# Patient Record
Sex: Female | Born: 1980 | Race: Black or African American | Hispanic: No | Marital: Single | Smoking: Former smoker
Health system: Southern US, Community
[De-identification: ages and names within clinical notes are randomized; demographics above are authoritative.]

## PROBLEM LIST (undated history)

## (undated) ENCOUNTER — Inpatient Hospital Stay (HOSPITAL_COMMUNITY): Payer: Self-pay

## (undated) DIAGNOSIS — Z789 Other specified health status: Secondary | ICD-10-CM

## (undated) HISTORY — PX: BREAST ENHANCEMENT SURGERY: SHX7

## (undated) HISTORY — PX: LIPOSUCTION: SHX10

---

## 2013-02-13 ENCOUNTER — Emergency Department (INDEPENDENT_AMBULATORY_CARE_PROVIDER_SITE_OTHER)
Admission: EM | Admit: 2013-02-13 | Discharge: 2013-02-13 | Disposition: A | Discharge status: Home / Self Care | Payer: Self-pay | Source: Home / Self Care

## 2013-02-13 ENCOUNTER — Encounter (HOSPITAL_COMMUNITY): Payer: Self-pay | Admitting: Emergency Medicine

## 2013-02-13 DIAGNOSIS — J029 Acute pharyngitis, unspecified: Secondary | ICD-10-CM

## 2013-02-13 DIAGNOSIS — B9789 Other viral agents as the cause of diseases classified elsewhere: Secondary | ICD-10-CM

## 2013-02-13 DIAGNOSIS — J028 Acute pharyngitis due to other specified organisms: Secondary | ICD-10-CM

## 2013-02-13 DIAGNOSIS — R0982 Postnasal drip: Secondary | ICD-10-CM

## 2013-02-13 DIAGNOSIS — J069 Acute upper respiratory infection, unspecified: Secondary | ICD-10-CM

## 2013-02-13 MED ORDER — AMPICILLIN 250 MG PO CAPS: 250.0000 mg | ORAL_CAPSULE | Freq: Four times a day (QID) | ORAL | Status: DC

## 2013-02-13 NOTE — Discharge Instructions (Signed)
Antibiotic Resistance Antibiotics are drugs. They fight infections caused by bacteria. Antibiotics greatly reduce illness and death from infectious diseases. Over time, the bacteria that antibiotics once controlled are much harder to kill. CAUSES  Antibiotic resistance occurs when bacteria change in some way. These changes can lessen the abilities of drugs designed to cure infections. The over-use of antibiotics can cause antibiotic resistance. Almost all important bacterial infections in the world are becoming resistant to drugs. Antibiotic resistance has been called one of the world's most pressing public health problems.  Antibiotics should be used to treat bacterial infections. But they are not effective against viral infections. These include the common cold, most sore throats, and the flu. Smart use of antibiotics will control the spread of resistance.  TREATMENT   Only use antibiotics as prescribed by your caregiver.  Talk with your caregiver about antibiotic resistance.  Ask what else you can do to feel better.  Do not take an antibiotic for a viral infection. This could be a cold, cough or the flu.  Do not save some of your antibiotic for the next time you get sick.  Take an antibiotic exactly as the caregiver tells you.  Do not take an antibiotic that is prescribed for someone else.  Use the antibiotic as directed. Take the correct dose at the scheduled time. SEEK MEDICAL CARE IF:  You react to the antibiotic with:  A rash.  Itching.  An upset stomach. Document Released: 03/25/2002 Document Revised: 03/27/2011 Document Reviewed: 10/28/2007 [facility] Patient Information 2014 [place].  Sore Throat A sore throat is pain, burning, irritation, or scratchiness of the throat. There is often pain or tenderness when swallowing or talking. A sore throat may be accompanied by other symptoms, such as coughing, sneezing, fever, and swollen neck glands. A sore throat is often  the first sign of another sickness, such as a cold, flu, strep throat, or mononucleosis (commonly known as mono). Most sore throats go away without medical treatment. CAUSES  The most common causes of a sore throat include:  A viral infection, such as a cold, flu, or mono.  A bacterial infection, such as strep throat, tonsillitis, or whooping cough.  Seasonal allergies.  Dryness in the air.  Irritants, such as smoke or pollution.  Gastroesophageal reflux disease (GERD). HOME CARE INSTRUCTIONS   Only take over-the-counter medicines as directed by your caregiver.  Drink enough fluids to keep your urine clear or pale yellow.  Rest as needed.  Try using throat sprays, lozenges, or sucking on hard candy to ease any pain (if older than 4 years or as directed).  Sip warm liquids, such as broth, herbal tea, or warm water with honey to relieve pain temporarily. You may also eat or drink cold or frozen liquids such as frozen ice pops.  Gargle with salt water (mix 1 tsp salt with 8 oz of water).  Do not smoke and avoid secondhand smoke.  Put a cool-mist humidifier in your bedroom at night to moisten the air. You can also turn on a hot shower and sit in the bathroom with the door closed for 5 10 minutes. SEEK IMMEDIATE MEDICAL CARE IF:  You have difficulty breathing.  You are unable to swallow fluids, soft foods, or your saliva.  You have increased swelling in the throat.  Your sore throat does not get better in 7 days.  You have nausea and vomiting.  You have a fever or persistent symptoms for more than 2 3 days.  You  have a fever and your symptoms suddenly get worse. MAKE SURE YOU:   Understand these instructions.  Will watch your condition.  Will get help right away if you are not doing well or get worse. Document Released: 02/10/2004 Document Revised: 12/20/2011 Document Reviewed: 09/10/2011 [facility] Patient Information 2014 [place], [place].  Upper Respiratory  Infection, Adult An upper respiratory infection (URI) is also sometimes known as the common cold. The upper respiratory tract includes the nose, sinuses, throat, trachea, and bronchi. Bronchi are the airways leading to the lungs. Most people improve within 1 week, but symptoms can last up to 2 weeks. A residual cough may last even longer.  CAUSES Many different viruses can infect the tissues lining the upper respiratory tract. The tissues become irritated and inflamed and often become very moist. Mucus production is also common. A cold is contagious. You can easily spread the virus to others by oral contact. This includes kissing, sharing a glass, coughing, or sneezing. Touching your mouth or nose and then touching a surface, which is then touched by another person, can also spread the virus. SYMPTOMS  Symptoms typically develop 1 to 3 days after you come in contact with a cold virus. Symptoms vary from person to person. They may include:  Runny nose.  Sneezing.  Nasal congestion.  Sinus irritation.  Sore throat.  Loss of voice (laryngitis).  Cough.  Fatigue.  Muscle aches.  Loss of appetite.  Headache.  Low-grade fever. DIAGNOSIS  You might diagnose your own cold based on familiar symptoms, since most people get a cold 2 to 3 times a year. Your caregiver can confirm this based on your exam. Most importantly, your caregiver can check that your symptoms are not due to another disease such as strep throat, sinusitis, pneumonia, asthma, or epiglottitis. Blood tests, throat tests, and X-rays are not necessary to diagnose a common cold, but they may sometimes be helpful in excluding other more serious diseases. Your caregiver will decide if any further tests are required. RISKS AND COMPLICATIONS  You may be at risk for a more severe case of the common cold if you smoke cigarettes, have chronic heart disease (such as heart failure) or lung disease (such as asthma), or if you have a  weakened immune system. The very young and very old are also at risk for more serious infections. Bacterial sinusitis, middle ear infections, and bacterial pneumonia can complicate the common cold. The common cold can worsen asthma and chronic obstructive pulmonary disease (COPD). Sometimes, these complications can require emergency medical care and may be life-threatening. PREVENTION  The best way to protect against getting a cold is to practice good hygiene. Avoid oral or hand contact with people with cold symptoms. Wash your hands often if contact occurs. There is no clear evidence that vitamin C, vitamin E, echinacea, or exercise reduces the chance of developing a cold. However, it is always recommended to get plenty of rest and practice good nutrition. TREATMENT  Treatment is directed at relieving symptoms. There is no cure. Antibiotics are not effective, because the infection is caused by a virus, not by bacteria. Treatment may include:  Increased fluid intake. Sports drinks offer valuable electrolytes, sugars, and fluids.  Breathing heated mist or steam (vaporizer or shower).  Eating chicken soup or other clear broths, and maintaining good nutrition.  Getting plenty of rest.  Using gargles or lozenges for comfort.  Controlling fevers with ibuprofen or acetaminophen as directed by your caregiver.  Increasing usage of your inhaler if  you have asthma. Zinc gel and zinc lozenges, taken in the first 24 hours of the common cold, can shorten the duration and lessen the severity of symptoms. Pain medicines may help with fever, muscle aches, and throat pain. A variety of non-prescription medicines are available to treat congestion and runny nose. Your caregiver can make recommendations and may suggest nasal or lung inhalers for other symptoms.  HOME CARE INSTRUCTIONS   Only take over-the-counter or prescription medicines for pain, discomfort, or fever as directed by your caregiver.  Use a warm  mist humidifier or inhale steam from a shower to increase air moisture. This may keep secretions moist and make it easier to breathe.  Drink enough water and fluids to keep your urine clear or pale yellow.  Rest as needed.  Return to work when your temperature has returned to normal or as your caregiver advises. You may need to stay home longer to avoid infecting others. You can also use a face mask and careful hand washing to prevent spread of the virus. SEEK MEDICAL CARE IF:   After the first few days, you feel you are getting worse rather than better.  You need your caregiver's advice about medicines to control symptoms.  You develop chills, worsening shortness of breath, or brown or red sputum. These may be signs of pneumonia.  You develop yellow or brown nasal discharge or pain in the face, especially when you bend forward. These may be signs of sinusitis.  You develop a fever, swollen neck glands, pain with swallowing, or white areas in the back of your throat. These may be signs of strep throat. SEEK IMMEDIATE MEDICAL CARE IF:   You have a fever.  You develop severe or persistent headache, ear pain, sinus pain, or chest pain.  You develop wheezing, a prolonged cough, cough up blood, or have a change in your usual mucus (if you have chronic lung disease).  You develop sore muscles or a stiff neck. Document Released: 06/28/2000 Document Revised: 03/27/2011 Document Reviewed: 05/06/2010 [facility] Patient Information 2014 [place], [place].

## 2013-02-13 NOTE — ED Provider Notes (Signed)
CSN: [id number]     Arrival date & time 02/13/13  1015 History   First MD Initiated Contact with Patient 02/13/13 1137     Chief Complaint  Patient presents with  . URI   (Consider location/radiation/quality/duration/timing/severity/associated sxs/prior Treatment) HPI Comments: 33 year old female who presents with a sore throat for 3 days, PND, occasional cough. Denies fever or stuffy nose. She does have a running nose. No facial pain.   History reviewed. No pertinent past medical history. No past surgical history on file. No family history on file. History  Substance Use Topics  . Smoking status: Not on file  . Smokeless tobacco: Not on file  . Alcohol Use: Not on file   OB History   Grav Para Term Preterm Abortions TAB SAB Ect Mult Living                 Review of Systems  Constitutional: Negative for fever, chills, activity change, appetite change and fatigue.  HENT: Positive for congestion, postnasal drip, rhinorrhea and sore throat. Negative for ear discharge and facial swelling.   Eyes: Negative.   Respiratory: Negative.  Negative for cough, shortness of breath and wheezing.   Cardiovascular: Negative.   Gastrointestinal: Negative.   Genitourinary: Negative.   Musculoskeletal: Negative for neck pain and neck stiffness.  Skin: Negative for pallor and rash.  Neurological: Negative.     Allergies  Review of patient's allergies indicates no known allergies.  Home Medications   Current Outpatient Rx  Name  Route  Sig  Dispense  Refill  . ampicillin (PRINCIPEN) 250 MG capsule   Oral   Take 1 capsule (250 mg total) by mouth 4 (four) times daily.   28 capsule   0    BP 119/78  Pulse 80  Temp(Src) 98.1 F (36.7 C) (Oral)  Resp 18  SpO2 97%  LMP 02/03/2013 Physical Exam  Nursing note and vitals reviewed. Constitutional: She is oriented to person, place, and time. She appears well-developed and well-nourished. No distress.  HENT:  Mouth/Throat: No  oropharyngeal exudate.  Bilateral TMs are normal oral pharynx with moderate erythema, cobblestoning and copious amount of clear PND.   Eyes: Conjunctivae and EOM are normal.  Neck: Normal range of motion. Neck supple.  Cardiovascular: Normal rate, regular rhythm and normal heart sounds.   Pulmonary/Chest: Effort normal and breath sounds normal. No respiratory distress. She has no wheezes. She has no rales.  Musculoskeletal: Normal range of motion. She exhibits no edema.  Lymphadenopathy:    She has no cervical adenopathy.  Neurological: She is alert and oriented to person, place, and time.  Skin: Skin is warm and dry. No rash noted.  Psychiatric: She has a normal mood and affect.    ED Course  Procedures (including critical care time) Labs Review Labs Reviewed - No data to display Imaging Review No results found.    MDM   1. URI (upper respiratory infection)   2. PND (post-nasal drip)   3. Sore throat (viral)      Pt has upcoming breast augmentation and insists on an antibiotic to clear all this out so she will not have to cancel surgery She is advised low probability of bacterial infection Alka seltzer cold plus  night timed  Med and tobitussin DM Plenty of fluids, nasal saline , netty pot  Hayden Rasmussenavid Presly Steinruck, NP 02/13/13 1211

## 2013-02-13 NOTE — ED Provider Notes (Signed)
Medical screening examination/treatment/procedure(s) were performed by non-physician practitioner and as supervising physician I was immediately available for consultation/collaboration.  Dontrey Snellgrove, M.D.   Viktorya Arguijo C Perl Folmar, MD 02/13/13 1910 

## 2013-02-13 NOTE — ED Notes (Signed)
C/o cold sx States she has sore throat, brown mucous congestion, achy back OTC medication taking but no relief.

## 2013-08-28 ENCOUNTER — Encounter (HOSPITAL_COMMUNITY): Payer: Self-pay | Admitting: Emergency Medicine

## 2013-08-28 ENCOUNTER — Emergency Department (INDEPENDENT_AMBULATORY_CARE_PROVIDER_SITE_OTHER)
Admission: EM | Admit: 2013-08-28 | Discharge: 2013-08-28 | Disposition: A | Discharge status: Home / Self Care | Payer: Self-pay | Source: Home / Self Care | Attending: Family Medicine | Admitting: Family Medicine

## 2013-08-28 ENCOUNTER — Emergency Department (INDEPENDENT_AMBULATORY_CARE_PROVIDER_SITE_OTHER): Payer: Self-pay

## 2013-08-28 DIAGNOSIS — L84 Corns and callosities: Secondary | ICD-10-CM

## 2013-08-28 NOTE — ED Provider Notes (Signed)
CSN: [id number]     Arrival date & time 08/28/13  1515 History   First MD Initiated Contact with Patient 08/28/13 1522     Chief Complaint  Patient presents with  . Foot Pain   (Consider location/radiation/quality/duration/timing/severity/associated sxs/prior Treatment) Patient is a 33 y.o. female presenting with lower extremity pain. The history is provided by the patient.  Foot Pain This is a new problem. The current episode started more than 1 week ago (3wks of tenderness). The problem has not changed since onset.Associated symptoms comments: Tender discolored subq lesion.. The symptoms are aggravated by walking.    History reviewed. No pertinent past medical history. History reviewed. No pertinent past surgical history. History reviewed. No pertinent family history. History  Substance Use Topics  . Smoking status: Not on file  . Smokeless tobacco: Not on file  . Alcohol Use: Not on file   OB History   Grav Para Term Preterm Abortions TAB SAB Ect Mult Living                 Review of Systems  Constitutional: Negative.   Musculoskeletal: Positive for gait problem.  Skin: Negative for wound.    Allergies  Review of patient's allergies indicates no known allergies.  Home Medications   Prior to Admission medications   Medication Sig Start Date End Date Taking? Authorizing Provider  ampicillin (PRINCIPEN) 250 MG capsule Take 1 capsule (250 mg total) by mouth 4 (four) times daily. 02/13/13   Hayden Rasmussenavid Mabe, NP   BP 121/83  Pulse 80  Temp(Src) 98 F (36.7 C) (Oral)  Resp 16  SpO2 99%  LMP 08/23/2013 Physical Exam  Nursing note and vitals reviewed. Constitutional: She is oriented to person, place, and time. She appears well-developed and well-nourished.  Musculoskeletal: She exhibits tenderness.  Tender discoloration to lat left heel skin, nodule feeling, no erythema.  Neurological: She is alert and oriented to person, place, and time.  Skin: Skin is warm and dry. No  erythema.    ED Course  Debridement Date/Time: 08/28/2013 4:08 PM Performed by: Linna HoffKINDL, Khai Arrona D Authorized by: Bradd CanaryKINDL, Osinachi Navarrette D Consent: Verbal consent obtained. Risks and benefits: risks, benefits and alternatives were discussed Consent given by: patient Preparation: Patient was prepped and draped in the usual sterile fashion. Local anesthesia used: no Patient sedated: no Patient tolerance: Patient tolerated the procedure well with no immediate complications. Comments: Corn debrided, no fb, no complications, no bleeding.   (including critical care time) Labs Review Labs Reviewed - No data to display  Imaging Review Dg Os Calcis Left  08/28/2013   CLINICAL DATA:  Lesion on the plantar, left side of the heel.  EXAM: LEFT OS CALCIS - 2+ VIEW  COMPARISON:  None.  FINDINGS: Imaged bones, joints and soft tissues appear normal.  IMPRESSION: Normal examination.   Electronically Signed   By: Drusilla Kannerhomas  Dalessio M.D.   On: 08/28/2013 15:42   X-rays reviewed and report per radiologist.    MDM   1. Corn of foot        Linna HoffJames D Alfonso Shackett, MD 08/28/13 [phone]

## 2013-08-28 NOTE — Discharge Instructions (Signed)
Dr scholl's corn pad to foot, return as needed.

## 2013-08-28 NOTE — ED Notes (Signed)
C/o left foot pain due to something inside of heel of foot States she can barely walk on foot No tx tried

## 2013-10-23 ENCOUNTER — Telehealth: Payer: Self-pay | Admitting: Obstetrics

## 2013-10-23 NOTE — Telephone Encounter (Signed)
10.08.2015 - brm - task completed

## 2013-10-28 NOTE — Telephone Encounter (Signed)
10.13.2015 - STILL UNABLE TO REACH PATIENT. BRM

## 2015-06-03 ENCOUNTER — Encounter (HOSPITAL_COMMUNITY): Payer: Self-pay | Admitting: *Deleted

## 2015-06-03 ENCOUNTER — Emergency Department (HOSPITAL_COMMUNITY)
Admission: EM | Admit: 2015-06-03 | Discharge: 2015-06-03 | Disposition: A | Discharge status: Home / Self Care | Payer: Medicaid Other | Attending: Emergency Medicine | Admitting: Emergency Medicine

## 2015-06-03 DIAGNOSIS — N730 Acute parametritis and pelvic cellulitis: Secondary | ICD-10-CM

## 2015-06-03 DIAGNOSIS — N390 Urinary tract infection, site not specified: Secondary | ICD-10-CM

## 2015-06-03 DIAGNOSIS — R102 Pelvic and perineal pain: Secondary | ICD-10-CM

## 2015-06-03 DIAGNOSIS — N739 Female pelvic inflammatory disease, unspecified: Secondary | ICD-10-CM | POA: Insufficient documentation

## 2015-06-03 DIAGNOSIS — F17210 Nicotine dependence, cigarettes, uncomplicated: Secondary | ICD-10-CM | POA: Diagnosis not present

## 2015-06-03 DIAGNOSIS — Z79899 Other long term (current) drug therapy: Secondary | ICD-10-CM | POA: Diagnosis not present

## 2015-06-03 LAB — URINE MICROSCOPIC-ADD ON

## 2015-06-03 LAB — HEPATIC FUNCTION PANEL
ALT: 17 U/L (ref 14–54)
AST: 21 U/L (ref 15–41)
Albumin: 4.4 g/dL (ref 3.5–5.0)
Alkaline Phosphatase: 62 U/L (ref 38–126)
Bilirubin, Direct: 0.1 mg/dL (ref 0.1–0.5)
Indirect Bilirubin: 0.6 mg/dL (ref 0.3–0.9)
Total Bilirubin: 0.7 mg/dL (ref 0.3–1.2)
Total Protein: 7.8 g/dL (ref 6.5–8.1)

## 2015-06-03 LAB — BASIC METABOLIC PANEL
Anion gap: 9 (ref 5–15)
BUN: 17 mg/dL (ref 6–20)
CO2: 26 mmol/L (ref 22–32)
Calcium: 9.5 mg/dL (ref 8.9–10.3)
Chloride: 102 mmol/L (ref 101–111)
Creatinine, Ser: 0.69 mg/dL (ref 0.44–1.00)
GFR calc Af Amer: >60 mL/min (ref >60)
GFR calc non Af Amer: >60 mL/min (ref >60)
Glucose, Bld: 89 mg/dL (ref 65–99)
Potassium: 3.8 mmol/L (ref 3.5–5.1)
Sodium: 137 mmol/L (ref 135–145)

## 2015-06-03 LAB — URINALYSIS, ROUTINE W REFLEX MICROSCOPIC
Bilirubin Urine: NEGATIVE
Glucose, UA: NEGATIVE mg/dL
Ketones, ur: NEGATIVE mg/dL
Leukocytes, UA: NEGATIVE
Nitrite: POSITIVE — AB
Protein, ur: NEGATIVE mg/dL
Specific Gravity, Urine: 1.036 — ABNORMAL HIGH (ref 1.005–1.030)
pH: 5.5 (ref 5.0–8.0)

## 2015-06-03 LAB — CBC WITH DIFFERENTIAL/PLATELET
Basophils Absolute: 0 10*3/uL (ref 0.0–0.1)
Basophils Relative: 0 %
Eosinophils Absolute: 0.2 10*3/uL (ref 0.0–0.7)
Eosinophils Relative: 4 %
HCT: 42.8 % (ref 36.0–46.0)
Hemoglobin: 14.6 g/dL (ref 12.0–15.0)
Lymphocytes Relative: 31 %
Lymphs Abs: 1.7 10*3/uL (ref 0.7–4.0)
MCH: 33.9 pg (ref 26.0–34.0)
MCHC: 34.1 g/dL (ref 30.0–36.0)
MCV: 99.3 fL (ref 78.0–100.0)
Monocytes Absolute: 0.4 10*3/uL (ref 0.1–1.0)
Monocytes Relative: 8 %
Neutro Abs: 3.1 10*3/uL (ref 1.7–7.7)
Neutrophils Relative %: 57 %
Platelets: 257 10*3/uL (ref 150–400)
RBC: 4.31 MIL/uL (ref 3.87–5.11)
RDW: 13.4 % (ref 11.5–15.5)
WBC: 5.5 10*3/uL (ref 4.0–10.5)

## 2015-06-03 LAB — WET PREP, GENITAL
Sperm: NONE SEEN
Trich, Wet Prep: NONE SEEN
Yeast Wet Prep HPF POC: NONE SEEN

## 2015-06-03 LAB — PREGNANCY, URINE: Preg Test, Ur: NEGATIVE

## 2015-06-03 MED ORDER — DOXYCYCLINE HYCLATE 100 MG PO CAPS: 100.0000 mg | ORAL_CAPSULE | Freq: Two times a day (BID) | ORAL | Status: DC

## 2015-06-03 MED ORDER — CEFTRIAXONE SODIUM 250 MG IJ SOLR
250.0000 mg | Freq: Once | INTRAMUSCULAR | Status: AC
  Administered 2015-06-03: 250 mg via INTRAMUSCULAR
  Filled 2015-06-03: qty 250

## 2015-06-03 MED ORDER — LIDOCAINE HCL 1 % IJ SOLN
INTRAMUSCULAR | Status: AC
  Administered 2015-06-03: 20 mL
  Filled 2015-06-03: qty 20

## 2015-06-03 MED ORDER — METRONIDAZOLE 500 MG PO TABS: 500.0000 mg | ORAL_TABLET | Freq: Two times a day (BID) | ORAL | Status: DC

## 2015-06-03 NOTE — Discharge Instructions (Signed)
Medications: Doxycycline, Flagyl  Treatment: Take doxycycline and Flagyl as prescribed for 2 weeks. If any of your lab results return POSITIVE, you will be called in 2-3 days. You will also be able to see this on MyChart. Please make any of your sexual partners aware of your results and the need for them to seek treatment as well. Stay hydrated with water as best as possible.  Follow-up: Please follow-up with your OB/GYN as soon as possible for recheck, your annual exam and further evaluation of your breast lump and tenderness. Please return to emergency department if you develop any new or worsening symptoms.   Urinary Tract Infection Urinary tract infections (UTIs) can develop anywhere along your urinary tract. Your urinary tract is your body's drainage system for removing wastes and extra water. Your urinary tract includes two kidneys, two ureters, a bladder, and a urethra. Your kidneys are a pair of bean-shaped organs. Each kidney is about the size of your fist. They are located below your ribs, one on each side of your spine. CAUSES Infections are caused by microbes, which are microscopic organisms, including fungi, viruses, and bacteria. These organisms are so small that they can only be seen through a microscope. Bacteria are the microbes that most commonly cause UTIs. SYMPTOMS  Symptoms of UTIs may vary by age and gender of the patient and by the location of the infection. Symptoms in young women typically include a frequent and intense urge to urinate and a painful, burning feeling in the bladder or urethra during urination. Older women and men are more likely to be tired, shaky, and weak and have muscle aches and abdominal pain. A fever may mean the infection is in your kidneys. Other symptoms of a kidney infection include pain in your back or sides below the ribs, nausea, and vomiting. DIAGNOSIS To diagnose a UTI, your caregiver will ask you about your symptoms. Your caregiver will also ask  you to provide a urine sample. The urine sample will be tested for bacteria and white blood cells. White blood cells are made by your body to help fight infection. TREATMENT  Typically, UTIs can be treated with medication. Because most UTIs are caused by a bacterial infection, they usually can be treated with the use of antibiotics. The choice of antibiotic and length of treatment depend on your symptoms and the type of bacteria causing your infection. HOME CARE INSTRUCTIONS  If you were prescribed antibiotics, take them exactly as your caregiver instructs you. Finish the medication even if you feel better after you have only taken some of the medication.  Drink enough water and fluids to keep your urine clear or pale yellow.  Avoid caffeine, tea, and carbonated beverages. They tend to irritate your bladder.  Empty your bladder often. Avoid holding urine for long periods of time.  Empty your bladder before and after sexual intercourse.  After a bowel movement, women should cleanse from front to back. Use each tissue only once. SEEK MEDICAL CARE IF:   You have back pain.  You develop a fever.  Your symptoms do not begin to resolve within 3 days. SEEK IMMEDIATE MEDICAL CARE IF:   You have severe back pain or lower abdominal pain.  You develop chills.  You have nausea or vomiting.  You have continued burning or discomfort with urination. MAKE SURE YOU:   Understand these instructions.  Will watch your condition.  Will get help right away if you are not doing well or get worse.  This information is not intended to replace advice given to you by your health care provider. Make sure you discuss any questions you have with your health care provider.   Document Released: 10/12/2004 Document Revised: 09/23/2014 Document Reviewed: 02/10/2011 Elsevier Interactive Patient Education 2016 Elsevier Inc.  Pelvic Inflammatory Disease Pelvic inflammatory disease (PID) refers to an  infection in some or all of the female organs. The infection can be in the uterus, ovaries, fallopian tubes, or the surrounding tissues in the pelvis. PID can cause abdominal or pelvic pain that comes on suddenly (acute pelvic pain). PID is a serious infection because it can lead to lasting (chronic) pelvic pain or the inability to have children (infertility). CAUSES This condition is most often caused by an infection that is spread during sexual contact. However, the infection can also be caused by the normal bacteria that are found in the vaginal tissues if these bacteria travel upward into the reproductive organs. PID can also occur following:  The birth of a baby.  A miscarriage.  An abortion.  Major pelvic surgery.  The use of an intrauterine device (IUD).  A sexual assault. RISK FACTORS This condition is more likely to develop in women who:  Are younger than 34 years of age.  Are sexually active at [facility].  Use nonbarrier contraception.  Have multiple sexual partners.  Have sex with someone who has symptoms of an STD (sexually transmitted disease).  Use oral contraception. At times, certain behaviors can also increase the possibility of getting PID, such as:  Using a vaginal douche.  Having an IUD in place. SYMPTOMS Symptoms of this condition include:  Abdominal or pelvic pain.  Fever.  Chills.  Abnormal vaginal discharge.  Abnormal uterine bleeding.  Unusual pain shortly after the end of a menstrual period.  Painful urination.  Pain with sexual intercourse.  Nausea and vomiting. DIAGNOSIS To diagnose this condition, your health care provider will do a physical exam and take your medical history. A pelvic exam typically reveals great tenderness in the uterus and the surrounding pelvic tissues. You may also have tests, such as:  Lab tests, including a pregnancy test, blood tests, and urine test.  Culture tests of the vagina and cervix to check for  an STD.  Ultrasound.  A laparoscopic procedure to look inside the pelvis.  Examining vaginal secretions under a microscope. TREATMENT Treatment for this condition may involve one or more approaches.  Antibiotic medicines may be prescribed to be taken by mouth.  Sexual partners may need to be treated if the infection is caused by an STD.  For more severe cases, hospitalization may be needed to give antibiotics directly into a vein through an IV tube.  Surgery may be needed if other treatments do not help, but this is rare. It may take weeks until you are completely well. If you are diagnosed with PID, you should also be checked for human immunodeficiency virus (HIV). Your health care provider may test you for infection again 3 months after treatment. You should not have unprotected sex. HOME CARE INSTRUCTIONS  Take over-the-counter and prescription medicines only as told by your health care provider.  If you were prescribed an antibiotic medicine, take it as told by your health care provider. Do not stop taking the antibiotic even if you start to feel better.  Do not have sexual intercourse until treatment is completed or as told by your health care provider. If PID is confirmed, your recent sexual partners will need treatment,  especially if you had unprotected sex.  Keep all follow-up visits as told by your health care provider. This is important. SEEK MEDICAL CARE IF:  You have increased or abnormal vaginal discharge.  Your pain does not improve.  You vomit.  You have a fever.  You cannot tolerate your medicines.  Your partner has an STD.  You have pain when you urinate. SEEK IMMEDIATE MEDICAL CARE IF:  You have increased abdominal or pelvic pain.  You have chills.  Your symptoms are not better in 72 hours even with treatment.   This information is not intended to replace advice given to you by your health care provider. Make sure you discuss any questions you have  with your health care provider.   Document Released: 01/02/2005 Document Revised: 09/23/2014 Document Reviewed: 02/09/2014 Elsevier Interactive Patient Education [employer]

## 2015-06-03 NOTE — Progress Notes (Addendum)
Medicaid [place] access response hx indicates the [facility] [address] [place], [place] [phone]  Entered in d/c instructions femina Schedule an appointment as soon as possible for a visit As needed- This is your assigned Medicaid [place] access doctor If you prefer to see another Medicaid doctor other than the one on your Medicaid card PLEASE CALL DSS [phone] or [phone] Medicaid [place] access response hx indicates the assigned pcp is [facility] [address] [place] [phone] Medicaid  Access Covered Patient [place]: [id number] [address] [place], [place] [id number] [email or web address]://dma.ncdhhs.gov/ Use this website to assist with understanding your coverage & to renew application As a Medicaid client you MUST contact DSS/SSI each time you change address, move to another [place] county or another state to keep your address updated  [place] Transportation to Dr appts if you are have full Medicaid: [phone], [phone]

## 2015-06-03 NOTE — ED Provider Notes (Signed)
CSN: [id number]     Arrival date & time 06/03/15  1345 History   First MD Initiated Contact with Patient 06/03/15 1753     Chief Complaint  Patient presents with  . Pelvic Pain     (Consider location/radiation/quality/duration/timing/severity/associated sxs/prior Treatment) HPI Comments: Patient is a 35 year old female who presents with pelvic pain. Patient describes her pelvic pain as a discomfort across her pelvic area, worse on the right side. Patient states she has had associated darker urine 1 month. Patient has also had associated dyspareunia 2 weeks, slight vaginal discharge, and right sided back pain. Patient has also had associated nausea and diarrhea 1 month. She also has associated urinary urgency with minimal amounts of fluid intake. She denies vomiting or bloody stools. Patient is sexually active with one partner currently. She does not use birth control. Her last menstrual period was before April 8. Patient does have a history of STD. Patient denies chest pain, shortness of breath, abdominal pain, burning on urination. Patient expresses a concern of a tender lump to her left breast. Patient has history of breast augmentation and states her left breast is firmer than the right, as it did not heal correctly. No family history of breast cancer.  Patient is a 35 y.o. female presenting with pelvic pain. The history is provided by the patient.  Pelvic Pain Associated symptoms include nausea. Pertinent negatives include no abdominal pain, chest pain, chills, fever, headaches, rash, sore throat or vomiting.    History reviewed. No pertinent past medical history. Past Surgical History  Procedure Laterality Date  . Breast enhancement surgery    . Liposuction     No family history on file. Social History  Substance Use Topics  . Smoking status: Current Every Day Smoker    Types: Cigarettes  . Smokeless tobacco: None  . Alcohol Use: Yes   OB History    No data available      Review of Systems  Constitutional: Negative for fever and chills.  HENT: Negative for facial swelling and sore throat.   Respiratory: Negative for shortness of breath.   Cardiovascular: Negative for chest pain.  Gastrointestinal: Positive for nausea and diarrhea. Negative for vomiting and abdominal pain.  Genitourinary: Positive for urgency, vaginal discharge, pelvic pain and dyspareunia. Negative for dysuria, vaginal bleeding and difficulty urinating.  Musculoskeletal: Negative for back pain.  Skin: Negative for rash and wound.  Neurological: Negative for headaches.  Psychiatric/Behavioral: The patient is not nervous/anxious.       Allergies  Review of patient's allergies indicates no known allergies.  Home Medications   Prior to Admission medications   Medication Sig Start Date End Date Taking? Authorizing Provider  doxycycline (VIBRAMYCIN) 100 MG capsule Take 1 capsule (100 mg total) by mouth 2 (two) times daily. 06/03/15   Emi Holes, PA-C  metroNIDAZOLE (FLAGYL) 500 MG tablet Take 1 tablet (500 mg total) by mouth 2 (two) times daily. 06/03/15   Avarae Zwart M Tadan Shill, PA-C   BP 117/80 mmHg  Pulse 67  Temp(Src) 98.1 F (36.7 C) (Oral)  Resp 18  SpO2 98%  LMP 06/03/2015 Physical Exam  Constitutional: She appears well-developed and well-nourished. No distress.  HENT:  Head: Normocephalic and atraumatic.  Mouth/Throat: Oropharynx is clear and moist. No oropharyngeal exudate.  Eyes: Conjunctivae are normal. Pupils are equal, round, and reactive to light. Right eye exhibits no discharge. Left eye exhibits no discharge. No scleral icterus.  Neck: Normal range of motion. Neck supple. No thyromegaly present.  Cardiovascular: Normal  rate, regular rhythm, normal heart sounds and intact distal pulses.  Exam reveals no gallop and no friction rub.   No murmur heard. Pulmonary/Chest: Effort normal and breath sounds normal. No stridor. No respiratory distress. She has no wheezes. She  has no rales. Right breast exhibits no inverted nipple, no nipple discharge and no skin change. Left breast exhibits no inverted nipple, no nipple discharge and no skin change. Breasts are symmetrical.    Abdominal: Soft. Bowel sounds are normal. She exhibits no distension. There is tenderness in the suprapubic area. There is no rebound, no guarding and no CVA tenderness.    Genitourinary: There is breast tenderness (Tender 1 cm, mobile nodule at 12 o'clock of L breast ). No breast swelling, discharge or bleeding. There is no rash, tenderness or lesion on the right labia. There is no rash, tenderness or lesion on the left labia. Cervix exhibits no motion tenderness. Right adnexum displays tenderness. Left adnexum displays no tenderness. There is bleeding in the vagina. No erythema or tenderness in the vagina. No foreign body around the vagina. No signs of injury around the vagina. Vaginal discharge found.  Cervix not visualized, very retroverted on manual exam  Musculoskeletal: She exhibits no edema.  Lymphadenopathy:    She has no cervical adenopathy.  Neurological: She is alert. Coordination normal.  Skin: Skin is warm and dry. No rash noted. She is not diaphoretic. No pallor.  Psychiatric: She has a normal mood and affect.  Nursing note and vitals reviewed.   ED Course  Procedures (including critical care time) Labs Review Labs Reviewed  WET PREP, GENITAL - Abnormal; Notable for the following:    Clue Cells Wet Prep HPF POC MANY (*)    WBC, Wet Prep HPF POC MANY (*)    All other components within normal limits  URINALYSIS, ROUTINE W REFLEX MICROSCOPIC (NOT AT [facility]) - Abnormal; Notable for the following:    APPearance CLOUDY (*)    Specific Gravity, Urine 1.036 (*)    Hgb urine dipstick MODERATE (*)    Nitrite POSITIVE (*)    All other components within normal limits  URINE MICROSCOPIC-ADD ON - Abnormal; Notable for the following:    Squamous Epithelial / LPF 0-5 (*)    Bacteria,  UA MANY (*)    All other components within normal limits  URINE CULTURE  PREGNANCY, URINE  BASIC METABOLIC PANEL  CBC WITH DIFFERENTIAL/PLATELET  HEPATIC FUNCTION PANEL  RPR  HIV ANTIBODY (ROUTINE TESTING)  GC/CHLAMYDIA PROBE AMP ([place]) NOT AT [facility]  GC/CHLAMYDIA PROBE AMP ([place]) NOT AT [facility]    Imaging Review No results found. I have personally reviewed and evaluated these images and lab results as part of my medical decision-making.   EKG Interpretation None      MDM   CBC, BMP unremarkable. Wet prep shows many clue cells and many WBCs. UA shows positive nitrates, many bacteria, moderate hematuria.  Blood visualized in the vaginal vault. Urine culture sent. Urine pregnancy negative. Patient advised to inform and treat all sexual partners.  Pt advised on safe sex practices and understands that they have GC/Chlamydia cultures pending and will result in 2-3 days. HIV and RPR sent. Patient treated in the ED for PID with ceftriaxone. Discharged with doxycycline and Flagyl. Discussed return precautions. Patient to follow-up with OB/GYN for recheck, and your exam and further evaluation of breast nodule in tenderness. Pt appears safe for discharge. Patient discussed with Dr. Criss Alvine who is in agreement with plan.  Final diagnoses:  Pelvic pain in female  PID (acute pelvic inflammatory disease)  UTI (lower urinary tract infection)     Emi Holeslexandra M Gregroy Dombkowski, PA-C 06/03/15 2253  Waylan BogaAlexandra M [place] Olkowski, PA-C 06/03/15 [id number]  Pricilla LovelessScott Goldston, MD 06/04/15 [phone]

## 2015-06-03 NOTE — ED Notes (Signed)
Pt reports pelvic pain with dark urine, and urinary frequency.  Pt reports she has been having unprotected sex with someone.  Pt reports very slight vaginal d/c

## 2015-06-04 LAB — GC/CHLAMYDIA PROBE AMP (~~LOC~~) NOT AT ARMC
Chlamydia: NEGATIVE
Neisseria Gonorrhea: NEGATIVE

## 2015-06-04 LAB — RPR: RPR Ser Ql: NONREACTIVE

## 2015-06-04 LAB — HIV ANTIBODY (ROUTINE TESTING W REFLEX): HIV Screen 4th Generation wRfx: NONREACTIVE

## 2015-06-06 LAB — URINE CULTURE: Culture: >=100000 — AB

## 2016-01-30 ENCOUNTER — Emergency Department (HOSPITAL_COMMUNITY)
Admission: EM | Admit: 2016-01-30 | Discharge: 2016-01-30 | Disposition: A | Discharge status: Home / Self Care | Payer: Medicaid Other | Attending: Emergency Medicine | Admitting: Emergency Medicine

## 2016-01-30 ENCOUNTER — Encounter (HOSPITAL_COMMUNITY): Payer: Self-pay | Admitting: Emergency Medicine

## 2016-01-30 DIAGNOSIS — Z79899 Other long term (current) drug therapy: Secondary | ICD-10-CM | POA: Diagnosis not present

## 2016-01-30 DIAGNOSIS — F17210 Nicotine dependence, cigarettes, uncomplicated: Secondary | ICD-10-CM | POA: Insufficient documentation

## 2016-01-30 DIAGNOSIS — T859XXA Unspecified complication of internal prosthetic device, implant and graft, initial encounter: Secondary | ICD-10-CM | POA: Insufficient documentation

## 2016-01-30 DIAGNOSIS — T8549XA Other mechanical complication of breast prosthesis and implant, initial encounter: Secondary | ICD-10-CM

## 2016-01-30 DIAGNOSIS — Y828 Other medical devices associated with adverse incidents: Secondary | ICD-10-CM | POA: Diagnosis not present

## 2016-01-30 NOTE — ED Triage Notes (Signed)
Pt noticed left saline breast implant having deflated today, was present yesterday. No pain.

## 2016-01-30 NOTE — ED Provider Notes (Signed)
[facility]-EMERGENCY DEPT Provider Note   CSN: [id number] Arrival date & time: 01/30/16  1130  By signing my name below, I, Soijett Blue, attest that this documentation has been prepared under the direction and in the presence of Langston Masker, PA-C Electronically Signed: Soijett Blue, ED Scribe. 01/30/16. 1:49 PM.  History   Chief Complaint Chief Complaint  Patient presents with  . Breast Problem    HPI Jordan Lucas is a 36 y.o. female who presents to the Emergency Department complaining of left breast problem onset this morning. Pt notes that she woke up this morning and noticed that left breast implant deflated.  Pt notes that she had breast augmentation completed by Dr. Shon Hough in 2015. Pt is having associated symptoms of generalized body aches. She hasn't tried any medications for the relief of her symptoms. She denies fever, chills, color change, wound, and any other symptoms.    The history is provided by the patient. No language interpreter was used.    History reviewed. No pertinent past medical history.  There are no active problems to display for this patient.   Past Surgical History:  Procedure Laterality Date  . BREAST ENHANCEMENT SURGERY    . LIPOSUCTION      OB History    No data available       Home Medications    Prior to Admission medications   Medication Sig Start Date End Date Taking? Authorizing Provider  doxycycline (VIBRAMYCIN) 100 MG capsule Take 1 capsule (100 mg total) by mouth 2 (two) times daily. 06/03/15   Emi Holes, PA-C  metroNIDAZOLE (FLAGYL) 500 MG tablet Take 1 tablet (500 mg total) by mouth 2 (two) times daily. 06/03/15   Emi Holes, PA-C    Family History History reviewed. No pertinent family history.  Social History Social History  Substance Use Topics  . Smoking status: Current Every Day Smoker    Types: Cigarettes  . Smokeless tobacco: Not on file  . Alcohol use Yes     Allergies   Patient has no known  allergies.   Review of Systems Review of Systems  Constitutional: Negative for fever.  Respiratory:       +left breast problem  Musculoskeletal: Positive for myalgias.  Skin: Negative for color change and wound.     Physical Exam Updated Vital Signs BP 135/94 (BP Location: Left Arm)   Pulse 89   Temp 97.8 F (36.6 C) (Oral)   Resp 18   Ht 5\' 7"  (1.702 m)   Wt 144 lb 3.2 oz (65.4 kg)   SpO2 100%   BMI 22.58 kg/m   Physical Exam  Constitutional: She is oriented to person, place, and time. She appears well-developed and well-nourished. No distress.  HENT:  Head: Normocephalic and atraumatic.  Eyes: EOM are normal.  Neck: Neck supple.  Cardiovascular: Normal rate, regular rhythm and normal heart sounds.  Exam reveals no gallop and no friction rub.   No murmur heard. Pulmonary/Chest: Effort normal and breath sounds normal. No respiratory distress. She has no wheezes. She has no rales. Left breast exhibits no inverted nipple, no nipple discharge, no skin change and no tenderness.  Right breast larger than left. No nipple discharge. No inverted nipple. No overlying skin changes. No tenderness.   Abdominal: She exhibits no distension.  Musculoskeletal: Normal range of motion.  Neurological: She is alert and oriented to person, place, and time.  Skin: Skin is warm and dry.  Psychiatric: She has a normal mood and affect.  Her behavior is normal.  Nursing note and vitals reviewed.    ED Treatments / Results  DIAGNOSTIC STUDIES: Oxygen Saturation is 100% on RA, nl by my interpretation.    COORDINATION OF CARE: 1:48 PM Discussed treatment plan with pt at bedside which includes follow up with Dr. Shon Houghruesdale and pt agreed to plan.  Procedures Procedures (including critical care time)  Medications Ordered in ED Medications - No data to display   Initial Impression / Assessment and Plan / ED Course  I have reviewed the triage vital signs and the nursing notes.   Clinical  Course     Pt has saline implants, no latex.  Implants were done by Dr. Shon Houghruesdale.  Pt advised to call Dr. Shon Houghtruesdale on Tuesday to schedule appointment   Final Clinical Impressions(s) / ED Diagnoses   Final diagnoses:  Complication of breast implant, initial encounter    New Prescriptions New Prescriptions   No medications on file    I personally performed the services in this documentation, which was scribed in my presence.  The recorded information has been reviewed and considered.   Barnet PallKaren SofiaPAC.    Lonia SkinnerLeslie K SalinenoSofia, PA-C 01/30/16 1830    Lavera Guiseana Duo Liu, MD 01/31/16 [phone]

## 2016-08-25 ENCOUNTER — Emergency Department (HOSPITAL_COMMUNITY)
Admission: EM | Admit: 2016-08-25 | Discharge: 2016-08-25 | Disposition: A | Discharge status: Home / Self Care | Payer: Medicaid Other | Source: Home / Self Care

## 2016-08-25 ENCOUNTER — Encounter (HOSPITAL_COMMUNITY): Payer: Self-pay | Admitting: *Deleted

## 2016-08-25 ENCOUNTER — Inpatient Hospital Stay (HOSPITAL_COMMUNITY)
Admission: EM | Admit: 2016-08-25 | Discharge: 2016-08-25 | Disposition: A | Discharge status: Home / Self Care | Payer: Medicaid Other | Attending: Family Medicine | Admitting: Family Medicine

## 2016-08-25 DIAGNOSIS — Z3201 Encounter for pregnancy test, result positive: Secondary | ICD-10-CM | POA: Diagnosis present

## 2016-08-25 DIAGNOSIS — Z3A19 19 weeks gestation of pregnancy: Secondary | ICD-10-CM | POA: Insufficient documentation

## 2016-08-25 LAB — CBC WITH DIFFERENTIAL/PLATELET
BASOS ABS: 0 10*3/uL (ref 0.0–0.1)
Basophils Relative: 0 %
Eosinophils Absolute: 0.5 10*3/uL (ref 0.0–0.7)
Eosinophils Relative: 5 %
HCT: 37.7 % (ref 36.0–46.0)
Hemoglobin: 12.9 g/dL (ref 12.0–15.0)
LYMPHS PCT: 24 %
Lymphs Abs: 2.4 10*3/uL (ref 0.7–4.0)
MCH: 33.9 pg (ref 26.0–34.0)
MCHC: 34.2 g/dL (ref 30.0–36.0)
MCV: 99 fL (ref 78.0–100.0)
Monocytes Absolute: 0.3 10*3/uL (ref 0.1–1.0)
Monocytes Relative: 3 %
Neutro Abs: 6.9 10*3/uL (ref 1.7–7.7)
Neutrophils Relative %: 68 %
PLATELETS: 242 10*3/uL (ref 150–400)
RBC: 3.81 MIL/uL — AB (ref 3.87–5.11)
RDW: 13.6 % (ref 11.5–15.5)
WBC: 10 10*3/uL (ref 4.0–10.5)

## 2016-08-25 LAB — ABO/RH: ABO/RH(D): B POS

## 2016-08-25 LAB — URINALYSIS, ROUTINE W REFLEX MICROSCOPIC
BACTERIA UA: NONE SEEN
Bilirubin Urine: NEGATIVE
Glucose, UA: NEGATIVE mg/dL
Ketones, ur: NEGATIVE mg/dL
Nitrite: NEGATIVE
PROTEIN: NEGATIVE mg/dL
Specific Gravity, Urine: 1.021 (ref 1.005–1.030)
pH: 5 (ref 5.0–8.0)

## 2016-08-25 LAB — POCT PREGNANCY, URINE: PREG TEST UR: POSITIVE — AB

## 2016-08-25 NOTE — MAU Note (Signed)
Pt unable to stay any longer wants to leave. Instructed pt to come back a t anytime if she still felt she needed to be seen for her complaint.

## 2016-08-25 NOTE — MAU Note (Signed)
Been having uncomfortableness in lower abd.  2 HPT about 5 days. No preg symptoms.  No period in June or July.

## 2016-08-26 ENCOUNTER — Encounter (HOSPITAL_COMMUNITY): Payer: Self-pay

## 2016-08-26 ENCOUNTER — Inpatient Hospital Stay (HOSPITAL_COMMUNITY)
Admission: EM | Admit: 2016-08-26 | Discharge: 2016-08-26 | Disposition: A | Discharge status: Home / Self Care | Payer: Medicaid Other | Source: Ambulatory Visit | Attending: Family Medicine | Admitting: Family Medicine

## 2016-08-26 DIAGNOSIS — O98311 Other infections with a predominantly sexual mode of transmission complicating pregnancy, first trimester: Secondary | ICD-10-CM | POA: Diagnosis not present

## 2016-08-26 DIAGNOSIS — A5901 Trichomonal vulvovaginitis: Secondary | ICD-10-CM | POA: Insufficient documentation

## 2016-08-26 DIAGNOSIS — Z3A13 13 weeks gestation of pregnancy: Secondary | ICD-10-CM

## 2016-08-26 DIAGNOSIS — Z79899 Other long term (current) drug therapy: Secondary | ICD-10-CM | POA: Diagnosis not present

## 2016-08-26 DIAGNOSIS — Z9889 Other specified postprocedural states: Secondary | ICD-10-CM | POA: Insufficient documentation

## 2016-08-26 DIAGNOSIS — Z7982 Long term (current) use of aspirin: Secondary | ICD-10-CM | POA: Diagnosis not present

## 2016-08-26 DIAGNOSIS — O99331 Smoking (tobacco) complicating pregnancy, first trimester: Secondary | ICD-10-CM | POA: Diagnosis not present

## 2016-08-26 LAB — RPR: RPR Ser Ql: NONREACTIVE

## 2016-08-26 LAB — WET PREP, GENITAL
Sperm: NONE SEEN
YEAST WET PREP: NONE SEEN

## 2016-08-26 MED ORDER — ONDANSETRON HCL 4 MG PO TABS
8.0000 mg | ORAL_TABLET | Freq: Once | ORAL | Status: AC
  Administered 2016-08-26: 8 mg via ORAL
  Filled 2016-08-26 (×2): qty 2

## 2016-08-26 MED ORDER — METRONIDAZOLE 500 MG PO TABS
2000.0000 mg | ORAL_TABLET | Freq: Once | ORAL | Status: AC
  Administered 2016-08-26: 2000 mg via ORAL
  Filled 2016-08-26: qty 4

## 2016-08-26 NOTE — MAU Note (Signed)
Patient presents after being here yesterday and having to leave.

## 2016-08-26 NOTE — Discharge Instructions (Signed)

## 2016-08-26 NOTE — MAU Provider Note (Signed)
History     CSN: [id number]  Arrival date and time: 08/26/16 4098   First Provider Initiated Contact with Patient 08/26/16 1015      Chief Complaint  Patient presents with  . Vaginal Discharge   G2P1001 @13 .2 wks here with vaginal discharge and desires confirmation of pregnancy. Discharge started 1 week ago and she describes as thin, white, and without odor or itching. New sexual partner about 3 months ago but is no longer together. She was not planning this pregnancy but plans to continue. Denies abdominal pain or vaginal bleeding. Remote hx of Trichomonas.     No past medical history on file.  Past Surgical History:  Procedure Laterality Date  . BREAST ENHANCEMENT SURGERY    . LIPOSUCTION      No family history on file.  Social History  Substance Use Topics  . Smoking status: Current Every Day Smoker    Types: Cigarettes  . Smokeless tobacco: Not on file  . Alcohol use Yes    Allergies: No Known Allergies  Prescriptions Prior to Admission  Medication Sig Dispense Refill Last Dose  . Aspirin-Salicylamide-Caffeine (BC HEADACHE POWDER PO) Take 1 packet by mouth 2 (two) times daily as needed (for pain).   Past Week at Unknown time  . ibuprofen (ADVIL,MOTRIN) 200 MG tablet Take 600-800 mg by mouth every 6 (six) hours as needed for headache, mild pain or moderate pain.   Past Week at Unknown time    Review of Systems  Gastrointestinal: Negative for abdominal pain.  Genitourinary: Positive for vaginal discharge. Negative for vaginal bleeding.   Physical Exam   Blood pressure 123/78, pulse 79, resp. rate 18, last menstrual period 05/25/2016.  Physical Exam  Constitutional: She is oriented to person, place, and time. She appears well-developed and well-nourished. No distress.  HENT:  Head: Normocephalic and atraumatic.  Neck: Normal range of motion.  Cardiovascular: Normal rate.   Respiratory: Effort normal. No respiratory distress.  GI: Soft. She exhibits no  distension. There is no tenderness.  Musculoskeletal: Normal range of motion.  Neurological: She is alert and oriented to person, place, and time.  Skin: Skin is warm and dry.  Psychiatric: She has a normal mood and affect.  FHT 144 bpm  Results for orders placed or performed during the hospital encounter of 08/26/16 (from the past 24 hour(s))  Wet prep, genital     Status: Abnormal   Collection Time: 08/26/16  9:55 AM  Result Value Ref Range   Yeast Wet Prep HPF POC NONE SEEN NONE SEEN   Trich, Wet Prep PRESENT (A) NONE SEEN   Clue Cells Wet Prep HPF POC PRESENT (A) NONE SEEN   WBC, Wet Prep HPF POC TOO NUMEROUS TO COUNT (A) NONE SEEN   Sperm NONE SEEN    UPT positive (yesterday here)  MAU Course  Procedures Flagyl 2g po Zofran x1  MDM Labs ordered and reviewed. Will treat for trich. Previous partner will need tx-pt will notify. No IC for 3 weeks and until neg TOC. Stable for discharge home. Pregnancy verification letter provided.  Assessment and Plan   1. [redacted] weeks gestation of pregnancy   2. Trichomonas vaginalis (TV) infection    Discharge home Follow up with OBGYN provider of choice to start care-list provided Safe meds in pregnancy list provided Start PNV OTC 1 po daily  Allergies as of 08/26/2016   No Known Allergies     Medication List    STOP taking these medications   BC  HEADACHE POWDER PO   ibuprofen 200 MG tablet Commonly known as:  Rondel BatonDVIL,MOTRIN      Stephanine Reas, CNM 08/26/2016, 11:04 AM

## 2016-08-29 LAB — GC/CHLAMYDIA PROBE AMP (~~LOC~~) NOT AT ARMC
Chlamydia: NEGATIVE
NEISSERIA GONORRHEA: NEGATIVE

## 2016-09-09 ENCOUNTER — Encounter (HOSPITAL_COMMUNITY): Payer: Self-pay | Admitting: Emergency Medicine

## 2016-09-09 ENCOUNTER — Ambulatory Visit (HOSPITAL_COMMUNITY)
Admission: EM | Admit: 2016-09-09 | Discharge: 2016-09-09 | Disposition: A | Discharge status: Home / Self Care | Payer: Medicaid Other | Attending: Family Medicine | Admitting: Family Medicine

## 2016-09-09 DIAGNOSIS — J029 Acute pharyngitis, unspecified: Secondary | ICD-10-CM

## 2016-09-09 DIAGNOSIS — B349 Viral infection, unspecified: Secondary | ICD-10-CM

## 2016-09-09 LAB — POCT RAPID STREP A: STREPTOCOCCUS, GROUP A SCREEN (DIRECT): NEGATIVE

## 2016-09-09 MED ORDER — IPRATROPIUM BROMIDE 0.06 % NA SOLN: 2.0000 | Freq: Four times a day (QID) | NASAL | 0 refills | Status: DC

## 2016-09-09 NOTE — ED Triage Notes (Signed)
Pt here for ST onset this am when she woke up.... sts she was fine last nigh  Sx also include dysphagia  Denies fevers, chills  Pt reports she is roughly 13 weeks preg  A&O x4... NAD.... Ambulatory

## 2016-09-09 NOTE — ED Provider Notes (Signed)
  [facility]   [id number] 09/09/16 Arrival Time: 1440  ASSESSMENT & PLAN:  1. Viral pharyngitis     Meds ordered this encounter  Medications  . ipratropium (ATROVENT) 0.06 % nasal spray    Sig: Place 2 sprays into both nostrils 4 (four) times daily.    Dispense:  15 mL    Refill:  0    Order Specific Question:   Supervising Provider    Answer:   Mardella Layman [[id number]]    Reviewed expectations re: course of current medical issues. Questions answered. Outlined signs and symptoms indicating need for more acute intervention. Patient verbalized understanding. After Visit Summary given.   SUBJECTIVE:  Jordan Lucas is a 36 y.o. female who presents with complaint of sore throat and uri sx's for one day.  ROS: As per HPI.   OBJECTIVE:  Vitals:   09/09/16 1515  BP: 118/81  Pulse: 91  Resp: 20  Temp: 97.9 F (36.6 C)  TempSrc: Oral  SpO2: 100%     General appearance: alert; no distress Eyes: PERRLA; EOMI; conjunctiva normal HENT: normocephalic; atraumatic; TMs normal; nasal mucosa normal; oral mucosa normal Neck: supple Lungs: clear to auscultation bilaterally Heart: regular rate and rhythm Abdomen: soft, non-tender; bowel sounds normal; no masses or organomegaly; no guarding or rebound tenderness Back: no CVA tenderness Extremities: no cyanosis or edema; symmetrical with no gross deformities Skin: warm and dry Neurologic: normal gait; normal symmetric reflexes Psychological: alert and cooperative; normal mood and affect  History reviewed. No pertinent past medical history.   has no past medical history on file.  Results for orders placed or performed during the hospital encounter of 09/09/16  POCT rapid strep A [facility])  Result Value Ref Range   Streptococcus, Group A Screen (Direct) NEGATIVE NEGATIVE    Labs Reviewed  POCT RAPID STREP A    Imaging: No results found.  No Known Allergies  No family history on file. Past Surgical  History:  Procedure Laterality Date  . BREAST ENHANCEMENT SURGERY    . LIPOSUCTION           Deatra Canter, FNP 09/09/16 1558

## 2016-09-12 LAB — CULTURE, GROUP A STREP (THRC)

## 2016-09-19 ENCOUNTER — Other Ambulatory Visit (HOSPITAL_COMMUNITY)
Admission: RE | Admit: 2016-09-19 | Discharge: 2016-09-19 | Disposition: A | Discharge status: Home / Self Care | Payer: Medicaid Other | Source: Ambulatory Visit | Attending: Obstetrics & Gynecology | Admitting: Obstetrics & Gynecology

## 2016-09-19 ENCOUNTER — Ambulatory Visit (INDEPENDENT_AMBULATORY_CARE_PROVIDER_SITE_OTHER): Payer: Medicaid Other | Admitting: Obstetrics & Gynecology

## 2016-09-19 ENCOUNTER — Encounter: Payer: Self-pay | Admitting: Obstetrics & Gynecology

## 2016-09-19 DIAGNOSIS — Z3490 Encounter for supervision of normal pregnancy, unspecified, unspecified trimester: Secondary | ICD-10-CM | POA: Diagnosis not present

## 2016-09-19 DIAGNOSIS — Z3482 Encounter for supervision of other normal pregnancy, second trimester: Secondary | ICD-10-CM | POA: Diagnosis not present

## 2016-09-19 DIAGNOSIS — O09522 Supervision of elderly multigravida, second trimester: Secondary | ICD-10-CM

## 2016-09-19 DIAGNOSIS — Z3A16 16 weeks gestation of pregnancy: Secondary | ICD-10-CM | POA: Diagnosis not present

## 2016-09-19 DIAGNOSIS — O09529 Supervision of elderly multigravida, unspecified trimester: Secondary | ICD-10-CM | POA: Insufficient documentation

## 2016-09-19 NOTE — Progress Notes (Signed)
  Subjective:    Jordan Lucas is a [id number] 2252w5d being seen today for her first obstetrical visit.  Her obstetrical history is significant for advanced maternal age. Patient is undecided if she intends to breast feed. Pregnancy history fully reviewed.  Patient reports no complaints.  Vitals:   09/19/16 1436  BP: 119/81  Pulse: 84  Temp: 97.9 F (36.6 C)  Weight: 155 lb (70.3 kg)    HISTORY: OB History  Gravida Para Term Preterm AB Living  4 1 1   2 1   SAB TAB Ectopic Multiple Live Births  0 2     1    # Outcome Date GA Lbr Len/2nd Weight Sex Delivery Anes PTL Lv  4 Current           3 Term 04/01/00 2724w4d  6 lb 2 oz (2.778 kg) F Vag-Spont   LIV  2 TAB  4253w0d         1 TAB  6946w0d            No past medical history on file. Past Surgical History:  Procedure Laterality Date  . BREAST ENHANCEMENT SURGERY    . LIPOSUCTION     No family history on file.   Exam    Uterus:   15 weeks  Pelvic Exam:    Perineum: No Hemorrhoids   Vulva: normal   Vagina:  normal mucosa   pH:     Cervix: no lesions   Adnexa: no mass, fullness, tenderness   Bony Pelvis: average  System: Breast:  normal appearance, no masses or tenderness   Skin: normal coloration and turgor, no rashes    Neurologic: oriented, normal mood   Extremities: normal strength, tone, and muscle mass   HEENT sclera clear, anicteric and thyroid without masses   Mouth/Teeth dental hygiene good   Neck supple and no masses   Cardiovascular: regular rate and rhythm, no murmurs or gallops   Respiratory:  ear and throat exam is normal, neck free of mass or lymphadenopathy, chest clear, no wheezing, crepitations, rhonchi, normal symmetric air entry   Abdomen: soft, non-tender; bowel sounds normal; no masses,  no organomegaly   Urinary: urethral meatus normal      Assessment:    Pregnancy: A5W0981G4P1021 Patient Active Problem List   Diagnosis Date Noted  . Supervision of normal pregnancy, antepartum 09/19/2016  .  Antepartum multigravida of advanced maternal age 02/20/2016        Plan:     Initial labs drawn. Prenatal vitamins. Problem list reviewed and updated. Genetic Screening discussed and requests NIPS today.  Ultrasound discussed; fetal survey: ordered.  Follow up in 4 weeks. 50% of 30 min visit spent on counseling and coordination of care.  Needs dating before AFP    Jordan Lucas 09/19/2016

## 2016-09-19 NOTE — Progress Notes (Signed)
Patient presents for NOB visit. AMA

## 2016-09-20 LAB — OBSTETRIC PANEL, INCLUDING HIV
Antibody Screen: NEGATIVE
BASOS ABS: 0 10*3/uL (ref 0.0–0.2)
Basos: 0 %
EOS (ABSOLUTE): 0.2 10*3/uL (ref 0.0–0.4)
EOS: 3 %
HEMATOCRIT: 35.2 % (ref 34.0–46.6)
HEP B S AG: NEGATIVE
HIV Screen 4th Generation wRfx: NONREACTIVE
Hemoglobin: 12.1 g/dL (ref 11.1–15.9)
IMMATURE GRANULOCYTES: 0 %
Immature Grans (Abs): 0 10*3/uL (ref 0.0–0.1)
Lymphocytes Absolute: 1.9 10*3/uL (ref 0.7–3.1)
Lymphs: 24 %
MCH: 34.1 pg — ABNORMAL HIGH (ref 26.6–33.0)
MCHC: 34.4 g/dL (ref 31.5–35.7)
MCV: 99 fL — AB (ref 79–97)
MONOCYTES: 9 %
Monocytes Absolute: 0.7 10*3/uL (ref 0.1–0.9)
NEUTROS PCT: 64 %
Neutrophils Absolute: 4.9 10*3/uL (ref 1.4–7.0)
PLATELETS: 237 10*3/uL (ref 150–379)
RBC: 3.55 x10E6/uL — ABNORMAL LOW (ref 3.77–5.28)
RDW: 12.9 % (ref 12.3–15.4)
RH TYPE: POSITIVE
RPR: NONREACTIVE
Rubella Antibodies, IGG: 2.93 index (ref >0.99)
WBC: 7.7 10*3/uL (ref 3.4–10.8)

## 2016-09-20 LAB — HEMOGLOBINOPATHY EVALUATION
HEMOGLOBIN A2 QUANTITATION: 2.8 % (ref 1.8–3.2)
HGB A: 97.2 % (ref 96.4–98.8)
HGB C: 0 %
HGB S: 0 %
HGB VARIANT: 0 %
Hemoglobin F Quantitation: 0 % (ref 0.0–2.0)

## 2016-09-20 LAB — VARICELLA ZOSTER ANTIBODY, IGG: Varicella zoster IgG: 1712 index (ref >165)

## 2016-09-21 LAB — CYTOLOGY - PAP
DIAGNOSIS: NEGATIVE
HPV: DETECTED — AB

## 2016-09-21 LAB — CERVICOVAGINAL ANCILLARY ONLY
Bacterial vaginitis: POSITIVE — AB
CANDIDA VAGINITIS: NEGATIVE
CHLAMYDIA, DNA PROBE: NEGATIVE
NEISSERIA GONORRHEA: NEGATIVE
Trichomonas: NEGATIVE

## 2016-09-21 LAB — URINE CULTURE, OB REFLEX

## 2016-09-21 LAB — CULTURE, OB URINE

## 2016-09-24 LAB — MATERNIT 21 PLUS CORE, BLOOD
CHROMOSOME 13: NEGATIVE
CHROMOSOME 18: NEGATIVE
CHROMOSOME 21: NEGATIVE
Y CHROMOSOME: DETECTED

## 2016-09-26 LAB — CYSTIC FIBROSIS MUTATION 97: Interpretation: NOT DETECTED

## 2016-09-27 ENCOUNTER — Encounter: Payer: Medicaid Other | Admitting: Certified Nurse Midwife

## 2016-09-29 ENCOUNTER — Encounter: Payer: Medicaid Other | Admitting: Certified Nurse Midwife

## 2016-10-06 ENCOUNTER — Other Ambulatory Visit: Payer: Self-pay | Admitting: Obstetrics & Gynecology

## 2016-10-06 ENCOUNTER — Other Ambulatory Visit (HOSPITAL_COMMUNITY): Payer: Self-pay | Admitting: *Deleted

## 2016-10-06 ENCOUNTER — Ambulatory Visit (HOSPITAL_COMMUNITY)
Admission: RE | Admit: 2016-10-06 | Discharge: 2016-10-06 | Disposition: A | Discharge status: Home / Self Care | Payer: Medicaid Other | Source: Ambulatory Visit | Attending: Obstetrics & Gynecology | Admitting: Obstetrics & Gynecology

## 2016-10-06 ENCOUNTER — Encounter (HOSPITAL_COMMUNITY): Payer: Self-pay

## 2016-10-06 DIAGNOSIS — F1210 Cannabis abuse, uncomplicated: Secondary | ICD-10-CM

## 2016-10-06 DIAGNOSIS — IMO0002 Reserved for concepts with insufficient information to code with codable children: Secondary | ICD-10-CM

## 2016-10-06 DIAGNOSIS — Z3689 Encounter for other specified antenatal screening: Secondary | ICD-10-CM | POA: Diagnosis not present

## 2016-10-06 DIAGNOSIS — Z0489 Encounter for examination and observation for other specified reasons: Secondary | ICD-10-CM

## 2016-10-06 DIAGNOSIS — Z3490 Encounter for supervision of normal pregnancy, unspecified, unspecified trimester: Secondary | ICD-10-CM

## 2016-10-06 DIAGNOSIS — O09529 Supervision of elderly multigravida, unspecified trimester: Secondary | ICD-10-CM

## 2016-10-06 DIAGNOSIS — O09522 Supervision of elderly multigravida, second trimester: Secondary | ICD-10-CM | POA: Insufficient documentation

## 2016-10-06 DIAGNOSIS — Z3A19 19 weeks gestation of pregnancy: Secondary | ICD-10-CM

## 2016-10-06 DIAGNOSIS — O99332 Smoking (tobacco) complicating pregnancy, second trimester: Secondary | ICD-10-CM | POA: Insufficient documentation

## 2016-10-06 DIAGNOSIS — Z3A16 16 weeks gestation of pregnancy: Secondary | ICD-10-CM | POA: Diagnosis not present

## 2016-10-06 HISTORY — DX: Other specified health status: Z78.9

## 2016-10-06 NOTE — Addendum Note (Signed)
Encounter addended by: Larance Ratledge M, RDMS on: 10/06/2016 11:23 AM<BR>    Actions taken: Imaging Exam ended

## 2016-10-17 ENCOUNTER — Ambulatory Visit (INDEPENDENT_AMBULATORY_CARE_PROVIDER_SITE_OTHER): Payer: Medicaid Other | Admitting: Obstetrics & Gynecology

## 2016-10-17 ENCOUNTER — Other Ambulatory Visit (HOSPITAL_COMMUNITY)
Admission: RE | Admit: 2016-10-17 | Discharge: 2016-10-17 | Disposition: A | Discharge status: Home / Self Care | Payer: Medicaid Other | Source: Ambulatory Visit | Attending: Obstetrics & Gynecology | Admitting: Obstetrics & Gynecology

## 2016-10-17 VITALS — BP 134/81 | HR 86 | Wt 155.3 lb

## 2016-10-17 DIAGNOSIS — Z3480 Encounter for supervision of other normal pregnancy, unspecified trimester: Secondary | ICD-10-CM

## 2016-10-17 DIAGNOSIS — Z3482 Encounter for supervision of other normal pregnancy, second trimester: Secondary | ICD-10-CM | POA: Diagnosis not present

## 2016-10-17 DIAGNOSIS — O09529 Supervision of elderly multigravida, unspecified trimester: Secondary | ICD-10-CM

## 2016-10-17 DIAGNOSIS — O09522 Supervision of elderly multigravida, second trimester: Secondary | ICD-10-CM

## 2016-10-17 NOTE — Progress Notes (Signed)
   PRENATAL VISIT NOTE  Subjective:  Jordan Lucas is a 36 y.o. Z6X0960 at [redacted]w[redacted]d being seen today for ongoing prenatal care.  She is currently monitored for the following issues for this low-risk pregnancy and has Supervision of normal pregnancy, antepartum and Antepartum multigravida of advanced maternal age on her problem list.  Patient reports no complaints.  Contractions: Not present. Vag. Bleeding: None.  Movement: Present. Denies leaking of fluid.   The following portions of the patient's history were reviewed and updated as appropriate: allergies, current medications, past family history, past medical history, past social history, past surgical history and problem list. Problem list updated.  Objective:   Vitals:   10/17/16 1456  BP: 134/81  Pulse: 86  Weight: 155 lb 4.8 oz (70.4 kg)    Fetal Status:     Movement: Present     General:  Alert, oriented and cooperative. Patient is in no acute distress.  Skin: Skin is warm and dry. No rash noted.   Cardiovascular: Normal heart rate noted  Respiratory: Normal respiratory effort, no problems with respiration noted  Abdomen: Soft, gravid, appropriate for gestational age.  Pain/Pressure: Absent     Pelvic: Cervical exam deferred        Extremities: Normal range of motion.  Edema: None  Mental Status:  Normal mood and affect. Normal behavior. Normal judgment and thought content.   Assessment and Plan:  Pregnancy: G4P1021 at [redacted]w[redacted]d  1. Supervision of other normal pregnancy, antepartum [redacted]w[redacted]d   2. Antepartum multigravida of advanced maternal age  - AFP, Serum, Open Spina Bifida  Preterm labor symptoms and general obstetric precautions including but not limited to vaginal bleeding, contractions, leaking of fluid and fetal movement were reviewed in detail with the patient. Please refer to After Visit Summary for other counseling recommendations.  Return in about 4 weeks (around 11/14/2016).   Scheryl Darter, MD Dates  recalculated based on [place]

## 2016-10-17 NOTE — Patient Instructions (Signed)

## 2016-10-18 LAB — CERVICOVAGINAL ANCILLARY ONLY
Bacterial vaginitis: POSITIVE — AB
Candida vaginitis: POSITIVE — AB
Chlamydia: POSITIVE — AB
Neisseria Gonorrhea: NEGATIVE
Trichomonas: NEGATIVE

## 2016-10-20 ENCOUNTER — Telehealth: Payer: Self-pay

## 2016-10-20 NOTE — Telephone Encounter (Signed)
Attempted to contact about lab results, phone # cannot accept calls. Sent my chart message to contact us.

## 2016-10-23 ENCOUNTER — Telehealth: Payer: Self-pay

## 2016-10-23 ENCOUNTER — Other Ambulatory Visit: Payer: Self-pay

## 2016-10-23 DIAGNOSIS — B9689 Other specified bacterial agents as the cause of diseases classified elsewhere: Secondary | ICD-10-CM

## 2016-10-23 DIAGNOSIS — A749 Chlamydial infection, unspecified: Secondary | ICD-10-CM

## 2016-10-23 DIAGNOSIS — B379 Candidiasis, unspecified: Secondary | ICD-10-CM

## 2016-10-23 DIAGNOSIS — N760 Acute vaginitis: Principal | ICD-10-CM

## 2016-10-23 MED ORDER — AZITHROMYCIN 500 MG PO TABS: 1000.0000 mg | ORAL_TABLET | Freq: Once | ORAL | 1 refills | Status: DC

## 2016-10-23 MED ORDER — METRONIDAZOLE 500 MG PO TABS: 500.0000 mg | ORAL_TABLET | Freq: Two times a day (BID) | ORAL | 0 refills | Status: AC

## 2016-10-23 MED ORDER — AZITHROMYCIN 500 MG PO TABS: 1000.0000 mg | ORAL_TABLET | Freq: Once | ORAL | 1 refills | Status: AC

## 2016-10-23 MED ORDER — TERCONAZOLE 0.8 % VA CREA: 1.0000 | TOPICAL_CREAM | Freq: Every day | VAGINAL | 0 refills | Status: DC

## 2016-10-23 NOTE — Telephone Encounter (Signed)
Pt informed of positive results. Per protocol rx sent to 1 gram zithromax

## 2016-10-25 LAB — AFP, SERUM, OPEN SPINA BIFIDA
AFP MOM: 0.72
AFP VALUE AFPOSL: 33.1 ng/mL
GEST. AGE ON COLLECTION DATE: 18 wk
Maternal Age At EDD: 35.9 yr
OSBR RISK 1 IN: 10000
Test Results:: NEGATIVE
WEIGHT: 155 [lb_av]

## 2016-11-01 ENCOUNTER — Ambulatory Visit (HOSPITAL_COMMUNITY)
Admission: RE | Admit: 2016-11-01 | Discharge: 2016-11-01 | Disposition: A | Discharge status: Home / Self Care | Payer: Medicaid Other | Source: Ambulatory Visit | Attending: Obstetrics and Gynecology | Admitting: Obstetrics and Gynecology

## 2016-11-01 ENCOUNTER — Encounter (HOSPITAL_COMMUNITY): Payer: Self-pay

## 2016-11-01 ENCOUNTER — Other Ambulatory Visit (HOSPITAL_COMMUNITY): Payer: Self-pay | Admitting: Obstetrics and Gynecology

## 2016-11-01 DIAGNOSIS — IMO0002 Reserved for concepts with insufficient information to code with codable children: Secondary | ICD-10-CM

## 2016-11-01 DIAGNOSIS — Z3480 Encounter for supervision of other normal pregnancy, unspecified trimester: Secondary | ICD-10-CM

## 2016-11-01 DIAGNOSIS — O09522 Supervision of elderly multigravida, second trimester: Secondary | ICD-10-CM | POA: Insufficient documentation

## 2016-11-01 DIAGNOSIS — Z362 Encounter for other antenatal screening follow-up: Secondary | ICD-10-CM | POA: Diagnosis present

## 2016-11-01 DIAGNOSIS — Z3A20 20 weeks gestation of pregnancy: Secondary | ICD-10-CM | POA: Diagnosis not present

## 2016-11-01 DIAGNOSIS — O99332 Smoking (tobacco) complicating pregnancy, second trimester: Secondary | ICD-10-CM | POA: Insufficient documentation

## 2016-11-01 DIAGNOSIS — Z0489 Encounter for examination and observation for other specified reasons: Secondary | ICD-10-CM

## 2016-11-03 ENCOUNTER — Ambulatory Visit (HOSPITAL_COMMUNITY): Payer: Medicaid Other

## 2016-11-14 ENCOUNTER — Ambulatory Visit (INDEPENDENT_AMBULATORY_CARE_PROVIDER_SITE_OTHER): Payer: Medicaid Other | Admitting: Obstetrics and Gynecology

## 2016-11-14 VITALS — BP 108/70 | HR 91 | Wt 161.0 lb

## 2016-11-14 DIAGNOSIS — Z3480 Encounter for supervision of other normal pregnancy, unspecified trimester: Secondary | ICD-10-CM

## 2016-11-14 DIAGNOSIS — O98812 Other maternal infectious and parasitic diseases complicating pregnancy, second trimester: Secondary | ICD-10-CM

## 2016-11-14 DIAGNOSIS — O09529 Supervision of elderly multigravida, unspecified trimester: Secondary | ICD-10-CM

## 2016-11-14 DIAGNOSIS — A749 Chlamydial infection, unspecified: Secondary | ICD-10-CM

## 2016-11-14 DIAGNOSIS — O98819 Other maternal infectious and parasitic diseases complicating pregnancy, unspecified trimester: Secondary | ICD-10-CM

## 2016-11-14 NOTE — Progress Notes (Signed)
Subjective:  Jordan Lucas is a 36 y.o. [id number] at 677w0d being seen today for ongoing prenatal care.  She is currently monitored for the following issues for this high-risk pregnancy and has Supervision of normal pregnancy, antepartum; Antepartum multigravida of advanced maternal age; and Chlamydia infection affecting pregnancy on her problem list.  Patient reports vaginal irritation.  Contractions: Not present. Vag. Bleeding: None.  Movement: Present. Denies leaking of fluid.   The following portions of the patient's history were reviewed and updated as appropriate: allergies, current medications, past family history, past medical history, past social history, past surgical history and problem list. Problem list updated.  Objective:   Vitals:   11/14/16 1352  BP: 108/70  Pulse: 91  Weight: 161 lb (73 kg)    Fetal Status: Fetal Heart Rate (bpm): 148   Movement: Present     General:  Alert, oriented and cooperative. Patient is in no acute distress.  Skin: Skin is warm and dry. No rash noted.   Cardiovascular: Normal heart rate noted  Respiratory: Normal respiratory effort, no problems with respiration noted  Abdomen: Soft, gravid, appropriate for gestational age. Pain/Pressure: Absent     Pelvic:  Cervical exam deferred        Extremities: Normal range of motion.  Edema: None  Mental Status: Normal mood and affect. Normal behavior. Normal judgment and thought content.   Urinalysis:      Assessment and Plan:  Pregnancy: G4P1021 at 137w0d  1. Chlamydia infection affecting pregnancy in second trimester Will need TOC next appt  2. Antepartum multigravida of advanced maternal age Neg NIPS  3. Supervision of other normal pregnancy, antepartum Stable Decline flu vaccine  Preterm labor symptoms and general obstetric precautions including but not limited to vaginal bleeding, contractions, leaking of fluid and fetal movement were reviewed in detail with the patient. Please refer to  After Visit Summary for other counseling recommendations.  Return in about 4 weeks (around 12/12/2016) for OB visit.   Hermina StaggersErvin, Vaanya Shambaugh L, MD

## 2016-11-14 NOTE — Progress Notes (Signed)
Pt states she is still feeling vaginal irritation. Pt recently tx for +CH, BV and yeast.

## 2016-11-28 ENCOUNTER — Telehealth: Payer: Self-pay

## 2016-11-28 MED ORDER — CYCLOBENZAPRINE HCL 10 MG PO TABS: 10.0000 mg | ORAL_TABLET | Freq: Three times a day (TID) | ORAL | 1 refills | Status: DC | PRN

## 2016-11-28 NOTE — Telephone Encounter (Signed)
Patient called in stating that her back has been hurting when she bends over, in addition to shoulder and leg pain. Patient states that muscles feel tense and pain was somewhat relieved by tylenol. Pt is requesting Flexeril, advised that it would be sent with provider approval.

## 2016-12-12 ENCOUNTER — Ambulatory Visit (INDEPENDENT_AMBULATORY_CARE_PROVIDER_SITE_OTHER): Payer: Medicaid Other | Admitting: Obstetrics and Gynecology

## 2016-12-12 ENCOUNTER — Other Ambulatory Visit (HOSPITAL_COMMUNITY)
Admission: RE | Admit: 2016-12-12 | Discharge: 2016-12-12 | Disposition: A | Discharge status: Home / Self Care | Payer: Medicaid Other | Source: Ambulatory Visit | Attending: Obstetrics and Gynecology | Admitting: Obstetrics and Gynecology

## 2016-12-12 ENCOUNTER — Other Ambulatory Visit: Payer: Medicaid Other

## 2016-12-12 ENCOUNTER — Encounter: Payer: Self-pay | Admitting: Obstetrics and Gynecology

## 2016-12-12 VITALS — BP 114/63 | HR 90 | Wt 161.0 lb

## 2016-12-12 DIAGNOSIS — O09522 Supervision of elderly multigravida, second trimester: Secondary | ICD-10-CM | POA: Insufficient documentation

## 2016-12-12 DIAGNOSIS — Z3A26 26 weeks gestation of pregnancy: Secondary | ICD-10-CM | POA: Diagnosis not present

## 2016-12-12 DIAGNOSIS — O98812 Other maternal infectious and parasitic diseases complicating pregnancy, second trimester: Secondary | ICD-10-CM

## 2016-12-12 DIAGNOSIS — Z3480 Encounter for supervision of other normal pregnancy, unspecified trimester: Secondary | ICD-10-CM

## 2016-12-12 DIAGNOSIS — A749 Chlamydial infection, unspecified: Secondary | ICD-10-CM | POA: Diagnosis not present

## 2016-12-12 DIAGNOSIS — O09529 Supervision of elderly multigravida, unspecified trimester: Secondary | ICD-10-CM

## 2016-12-12 MED ORDER — CLOBETASOL PROP EMOLLIENT BASE 0.05 % EX CREA: TOPICAL_CREAM | CUTANEOUS | 0 refills | Status: DC

## 2016-12-12 NOTE — Progress Notes (Signed)
Pt presents for ROB and 2 hr GTT today.   Pt c/o:thick white discharge/yellow, with a odor x 1 wk now pt wants to be checked for yeast.  Pt Declines T-Dap.

## 2016-12-12 NOTE — Progress Notes (Signed)
   PRENATAL VISIT NOTE  Subjective:  Torina Layla BarterStorms is a 36 y.o. Z6X0960G4P1021 at 456w0d being seen today for ongoing prenatal care.  She is currently monitored for the following issues for this high-risk pregnancy and has Supervision of normal pregnancy, antepartum; Antepartum multigravida of advanced maternal age; and Chlamydia infection affecting pregnancy on their problem list.  Patient reports thick white, non pruritic and odorless discharge. Patient desires to be tested for yeast.  Contractions: Not present. Vag. Bleeding: None.  Movement: Present. Denies leaking of fluid.   The following portions of the patient's history were reviewed and updated as appropriate: allergies, current medications, past family history, past medical history, past social history, past surgical history and problem list. Problem list updated.  Objective:   Vitals:   12/12/16 0951  BP: 114/63  Pulse: 90  Weight: 161 lb (73 kg)    Fetal Status: Fetal Heart Rate (bpm): 140 Fundal Height: 26 cm Movement: Present     General:  Alert, oriented and cooperative. Patient is in no acute distress.  Skin: Skin is warm and dry. No rash noted.   Cardiovascular: Normal heart rate noted  Respiratory: Normal respiratory effort, no problems with respiration noted  Abdomen: Soft, gravid, appropriate for gestational age.  Pain/Pressure: Absent     Pelvic: Cervical exam deferred        Extremities: Normal range of motion.  Edema: Mild pitting, slight indentation  Mental Status:  Normal mood and affect. Normal behavior. Normal judgment and thought content.   Assessment and Plan:  Pregnancy: G4P1021 at 616w0d  1. Supervision of other normal pregnancy, antepartum Patient is doing well Wet prep and cultures collected Glucola and third trimester labs today Patient declined tdap - Glucose Tolerance, 2 Hours w/1 Hour - CBC - HIV antibody - RPR  2. Antepartum multigravida of advanced maternal age Low risk NIPS  3. Chlamydia  infection affecting pregnancy in second trimester TOC today - Cervicovaginal ancillary only  Preterm labor symptoms and general obstetric precautions including but not limited to vaginal bleeding, contractions, leaking of fluid and fetal movement were reviewed in detail with the patient. Please refer to After Visit Summary for other counseling recommendations.  Return in about 2 weeks (around 12/26/2016) for ROB.   Catalina AntiguaPeggy Marga Gramajo, MD

## 2016-12-12 NOTE — Addendum Note (Signed)
Addended by: Catalina AntiguaONSTANT, Peighton Mehra on: 12/12/2016 02:49 PM   Modules accepted: Orders

## 2016-12-13 LAB — CERVICOVAGINAL ANCILLARY ONLY
BACTERIAL VAGINITIS: POSITIVE — AB
Candida vaginitis: POSITIVE — AB
Chlamydia: NEGATIVE
NEISSERIA GONORRHEA: NEGATIVE
Trichomonas: NEGATIVE

## 2016-12-13 LAB — GLUCOSE TOLERANCE, 2 HOURS W/ 1HR
GLUCOSE, 1 HOUR: 139 mg/dL (ref 65–179)
GLUCOSE, 2 HOUR: 91 mg/dL (ref 65–152)
GLUCOSE, FASTING: 83 mg/dL (ref 65–91)

## 2016-12-13 LAB — GC/CHLAMYDIA PROBE AMP (~~LOC~~) NOT AT ARMC
CHLAMYDIA, DNA PROBE: NEGATIVE
NEISSERIA GONORRHEA: NEGATIVE

## 2016-12-13 LAB — CBC
HEMOGLOBIN: 11.1 g/dL (ref 11.1–15.9)
Hematocrit: 32.4 % — ABNORMAL LOW (ref 34.0–46.6)
MCH: 32.6 pg (ref 26.6–33.0)
MCHC: 34.3 g/dL (ref 31.5–35.7)
MCV: 95 fL (ref 79–97)
PLATELETS: 223 10*3/uL (ref 150–379)
RBC: 3.41 x10E6/uL — AB (ref 3.77–5.28)
RDW: 13.1 % (ref 12.3–15.4)
WBC: 7.8 10*3/uL (ref 3.4–10.8)

## 2016-12-13 LAB — RPR: RPR: NONREACTIVE

## 2016-12-13 LAB — HIV ANTIBODY (ROUTINE TESTING W REFLEX): HIV SCREEN 4TH GENERATION: NONREACTIVE

## 2016-12-20 ENCOUNTER — Telehealth: Payer: Self-pay

## 2016-12-20 DIAGNOSIS — B379 Candidiasis, unspecified: Secondary | ICD-10-CM

## 2016-12-20 DIAGNOSIS — B9689 Other specified bacterial agents as the cause of diseases classified elsewhere: Secondary | ICD-10-CM

## 2016-12-20 DIAGNOSIS — N760 Acute vaginitis: Secondary | ICD-10-CM

## 2016-12-20 MED ORDER — METRONIDAZOLE 0.75 % VA GEL: 1.0000 | Freq: Every day | VAGINAL | 1 refills | Status: DC

## 2016-12-20 MED ORDER — FLUCONAZOLE 150 MG PO TABS: 150.0000 mg | ORAL_TABLET | Freq: Once | ORAL | 0 refills | Status: AC

## 2016-12-20 NOTE — Telephone Encounter (Signed)
Pt called about her lab results. She has BV and Yeast. Pt states that she was not able to complete the metronidazole during her last treatment due to medication making her nauseous. She thinks that this may be why she still has it. Pt states that she prefers the gel. Rx sent for metrogel and diflucan for yeast

## 2016-12-27 ENCOUNTER — Encounter: Payer: Medicaid Other | Admitting: Obstetrics and Gynecology

## 2016-12-28 ENCOUNTER — Encounter: Payer: Self-pay | Admitting: Obstetrics and Gynecology

## 2016-12-28 ENCOUNTER — Ambulatory Visit (INDEPENDENT_AMBULATORY_CARE_PROVIDER_SITE_OTHER): Payer: Medicaid Other | Admitting: Obstetrics and Gynecology

## 2016-12-28 VITALS — BP 116/67 | HR 97 | Wt 164.0 lb

## 2016-12-28 DIAGNOSIS — O98812 Other maternal infectious and parasitic diseases complicating pregnancy, second trimester: Secondary | ICD-10-CM

## 2016-12-28 DIAGNOSIS — A749 Chlamydial infection, unspecified: Secondary | ICD-10-CM

## 2016-12-28 DIAGNOSIS — Z3480 Encounter for supervision of other normal pregnancy, unspecified trimester: Secondary | ICD-10-CM

## 2016-12-28 DIAGNOSIS — O09529 Supervision of elderly multigravida, unspecified trimester: Secondary | ICD-10-CM

## 2016-12-28 MED ORDER — PRENATE PIXIE 10-0.6-0.4-200 MG PO CAPS: 1.0000 | ORAL_CAPSULE | Freq: Every day | ORAL | 12 refills | Status: DC

## 2016-12-28 NOTE — Progress Notes (Signed)
Patient reports good fetal movement, denies pain. 

## 2016-12-28 NOTE — Patient Instructions (Signed)
Places to have your son circumcised:    [facility] [phone] $[phone] while you are in hospital  [facility] [phone] $[phone] by 4 wks  [facility] [phone] $[phone] by 2 wks  [facility] [phone] $[phone] by 7 days [facility] [phone] $[phone] by 4 wks  These prices sometimes change but are roughly what you can expect to pay. Please call and confirm pricing.   Circumcision is considered an elective/non-medically necessary procedure. There are many reasons parents decide to have their sons circumsized. During the first year of life circumcised males have a reduced risk of urinary tract infections but after this year the rates between circumcised males and uncircumcised males are the same.  It is safe to have your son circumcised outside of the hospital and the places above perform them regularly.   Contraception Choices Contraception (birth control) is the use of any methods or devices to prevent pregnancy. Below are some methods to help avoid pregnancy. Hormonal methods  Contraceptive implant. This is a thin, plastic tube containing progesterone hormone. It does not contain estrogen hormone. Your health care provider inserts the tube in the inner part of the upper arm. The tube can remain in place for up to 3 years. After 3 years, the implant must be removed. The implant prevents the ovaries from releasing an egg (ovulation), thickens the cervical mucus to prevent sperm from entering the uterus, and thins the lining of the inside of the uterus.  Progesterone-only injections. These injections are given every 3 months by your health care provider to prevent pregnancy. This synthetic progesterone hormone stops the ovaries from releasing eggs. It also thickens cervical mucus and changes the uterine lining. This makes it harder for sperm to survive  in the uterus.  Birth control pills. These pills contain estrogen and progesterone hormone. They work by preventing the ovaries from releasing eggs (ovulation). They also cause the cervical mucus to thicken, preventing the sperm from entering the uterus. Birth control pills are prescribed by a health care provider.Birth control pills can also be used to treat heavy periods.  Minipill. This type of birth control pill contains only the progesterone hormone. They are taken every day of each month and must be prescribed by your health care provider.  Birth control patch. The patch contains hormones similar to those in birth control pills. It must be changed once a week and is prescribed by a health care provider.  Vaginal ring. The ring contains hormones similar to those in birth control pills. It is left in the vagina for 3 weeks, removed for 1 week, and then a new one is put back in place. The patient must be comfortable inserting and removing the ring from the vagina.A health care provider's prescription is necessary.  Emergency contraception. Emergency contraceptives prevent pregnancy after unprotected sexual intercourse. This pill can be taken right after sex or up to 5 days after unprotected sex. It is most effective the sooner you take the pills after having sexual intercourse. Most emergency contraceptive pills are available without a prescription. Check with your pharmacist. Do not use emergency contraception as your only form of birth control. Barrier methods  Female condom. This is a thin sheath (latex or rubber) that is worn over the penis during sexual intercourse. It can be used with spermicide to increase effectiveness.  Female condom. This is a soft, loose-fitting sheath that is put into the vagina before sexual intercourse.  Diaphragm. This is a soft, latex, dome-shaped barrier that must be fitted by a health   care provider. It is inserted into the vagina, along with a spermicidal jelly.  It is inserted before intercourse. The diaphragm should be left in the vagina for 6 to 8 hours after intercourse.  Cervical cap. This is a round, soft, latex or plastic cup that fits over the cervix and must be fitted by a health care provider. The cap can be left in place for up to 48 hours after intercourse.  Sponge. This is a soft, circular piece of polyurethane foam. The sponge has spermicide in it. It is inserted into the vagina after wetting it and before sexual intercourse.  Spermicides. These are chemicals that kill or block sperm from entering the cervix and uterus. They come in the form of creams, jellies, suppositories, foam, or tablets. They do not require a prescription. They are inserted into the vagina with an applicator before having sexual intercourse. The process must be repeated every time you have sexual intercourse. Intrauterine contraception  Intrauterine device (IUD). This is a T-shaped device that is put in a woman's uterus during a menstrual period to prevent pregnancy. There are 2 types: ? Copper IUD. This type of IUD is wrapped in copper wire and is placed inside the uterus. Copper makes the uterus and fallopian tubes produce a fluid that kills sperm. It can stay in place for 10 years. ? Hormone IUD. This type of IUD contains the hormone progestin (synthetic progesterone). The hormone thickens the cervical mucus and prevents sperm from entering the uterus, and it also thins the uterine lining to prevent implantation of a fertilized egg. The hormone can weaken or kill the sperm that get into the uterus. It can stay in place for 3-5 years, depending on which type of IUD is used. Permanent methods of contraception  Female tubal ligation. This is when the woman's fallopian tubes are surgically sealed, tied, or blocked to prevent the egg from traveling to the uterus.  Hysteroscopic sterilization. This involves placing a small coil or insert into each fallopian tube. Your doctor  uses a technique called hysteroscopy to do the procedure. The device causes scar tissue to form. This results in permanent blockage of the fallopian tubes, so the sperm cannot fertilize the egg. It takes about 3 months after the procedure for the tubes to become blocked. You must use another form of birth control for these 3 months.  Female sterilization. This is when the female has the tubes that carry sperm tied off (vasectomy).This blocks sperm from entering the vagina during sexual intercourse. After the procedure, the man can still ejaculate fluid (semen). Natural planning methods  Natural family planning. This is not having sexual intercourse or using a barrier method (condom, diaphragm, cervical cap) on days the woman could become pregnant.  Calendar method. This is keeping track of the length of each menstrual cycle and identifying when you are fertile.  Ovulation method. This is avoiding sexual intercourse during ovulation.  Symptothermal method. This is avoiding sexual intercourse during ovulation, using a thermometer and ovulation symptoms.  Post-ovulation method. This is timing sexual intercourse after you have ovulated. Regardless of which type or method of contraception you choose, it is important that you use condoms to protect against the transmission of sexually transmitted infections (STIs). Talk with your health care provider about which form of contraception is most appropriate for you. This information is not intended to replace advice given to you by your health care provider. Make sure you discuss any questions you have with your health care provider.   Document Released: 01/02/2005 Document Revised: 06/10/2015 Document Reviewed: 06/27/2012 Elsevier Interactive Patient Education  2017 [employer]  

## 2016-12-28 NOTE — Progress Notes (Signed)
   PRENATAL VISIT NOTE  Subjective:  Jordan Lucas is a 36 y.o. [id number] at 2219w2d being seen today for ongoing prenatal care.  She is currently monitored for the following issues for this low-risk pregnancy and has Supervision of normal pregnancy, antepartum; Antepartum multigravida of advanced maternal age; and Chlamydia infection affecting pregnancy on their problem list.  Patient reports no complaints.  Contractions: Not present. Vag. Bleeding: None.  Movement: Present. Denies leaking of fluid.   The following portions of the patient's history were reviewed and updated as appropriate: allergies, current medications, past family history, past medical history, past social history, past surgical history and problem list. Problem list updated.  Objective:   Vitals:   12/28/16 1607  BP: 116/67  Pulse: 97  Weight: 164 lb (74.4 kg)    Fetal Status: Fetal Heart Rate (bpm): 149 Fundal Height: 29 cm Movement: Present     General:  Alert, oriented and cooperative. Patient is in no acute distress.  Skin: Skin is warm and dry. No rash noted.   Cardiovascular: Normal heart rate noted  Respiratory: Normal respiratory effort, no problems with respiration noted  Abdomen: Soft, gravid, appropriate for gestational age.  Pain/Pressure: Absent     Pelvic: Cervical exam deferred        Extremities: Normal range of motion.  Edema: Trace  Mental Status:  Normal mood and affect. Normal behavior. Normal judgment and thought content.   Assessment and Plan:  Pregnancy: G4P1021 at 4019w2d  1. Antepartum multigravida of advanced maternal age Growth US 3rd trimester  2. Chlamydia infection affecting pregnancy in second trimester TOC negative for CT  3. Supervision of other normal pregnancy, antepartum Reviewed options for birth control including oral contraceptive pills (combination and progesterone only), NuvaRing, Depo-Provera, Nexplanon, IUDs (copper and levonorgestrol), BTL. Reviewed risks/benefits/side  effects of each. Answered all questions.  Script for PNV sent  Preterm labor symptoms and general obstetric precautions including but not limited to vaginal bleeding, contractions, leaking of fluid and fetal movement were reviewed in detail with the patient. Please refer to After Visit Summary for other counseling recommendations.  Return in about 2 weeks (around 01/11/2017) for OB visit.   Conan BowensKelly M Davis, MD

## 2017-01-17 ENCOUNTER — Encounter: Payer: Self-pay | Admitting: Obstetrics and Gynecology

## 2017-01-17 ENCOUNTER — Ambulatory Visit (INDEPENDENT_AMBULATORY_CARE_PROVIDER_SITE_OTHER): Payer: Medicaid Other | Admitting: Obstetrics and Gynecology

## 2017-01-17 VITALS — BP 115/65 | HR 102 | Wt 165.6 lb

## 2017-01-17 DIAGNOSIS — A749 Chlamydial infection, unspecified: Secondary | ICD-10-CM

## 2017-01-17 DIAGNOSIS — Z3480 Encounter for supervision of other normal pregnancy, unspecified trimester: Secondary | ICD-10-CM

## 2017-01-17 DIAGNOSIS — O98812 Other maternal infectious and parasitic diseases complicating pregnancy, second trimester: Secondary | ICD-10-CM

## 2017-01-17 DIAGNOSIS — O09529 Supervision of elderly multigravida, unspecified trimester: Secondary | ICD-10-CM

## 2017-01-17 NOTE — Progress Notes (Signed)
   PRENATAL VISIT NOTE  Subjective:  Jordan Lucas is a 37 y.o. [id number] at 1672w1d being seen today for ongoing prenatal care.  She is currently monitored for the following issues for this low-risk pregnancy and has Supervision of normal pregnancy, antepartum; Antepartum multigravida of advanced maternal age; and Chlamydia infection affecting pregnancy on their problem list.  Patient reports right hand numbness.  Contractions: Not present. Vag. Bleeding: None.  Movement: Present. Denies leaking of fluid.   The following portions of the patient's history were reviewed and updated as appropriate: allergies, current medications, past family history, past medical history, past social history, past surgical history and problem list. Problem list updated.  Objective:   Vitals:   01/17/17 1417  BP: 115/65  Pulse: (!) 102  Weight: 165 lb 9.6 oz (75.1 kg)    Fetal Status: Fetal Heart Rate (bpm): 145 Fundal Height: 31 cm Movement: Present     General:  Alert, oriented and cooperative. Patient is in no acute distress.  Skin: Skin is warm and dry. No rash noted.   Cardiovascular: Normal heart rate noted  Respiratory: Normal respiratory effort, no problems with respiration noted  Abdomen: Soft, gravid, appropriate for gestational age.  Pain/Pressure: Absent     Pelvic: Cervical exam deferred        Extremities: Normal range of motion.  Edema: None  Mental Status:  Normal mood and affect. Normal behavior. Normal judgment and thought content.   Assessment and Plan:  Pregnancy: G4P1021 at 5572w1d  1. Supervision of other normal pregnancy, antepartum Patient is doing well Advised the use of a wrist brace to help with carpal tunnel syndrome  2. Antepartum multigravida of advanced maternal age Low risks NIPS Follow up growth ultrasound ordered  3. Chlamydia infection affecting pregnancy in second trimester Negative test of cure  Preterm labor symptoms and general obstetric precautions including  but not limited to vaginal bleeding, contractions, leaking of fluid and fetal movement were reviewed in detail with the patient. Please refer to After Visit Summary for other counseling recommendations.  Return in about 2 weeks (around 01/31/2017) for ROB.   Catalina AntiguaPeggy Sheranda Seabrooks, MD

## 2017-01-17 NOTE — Patient Instructions (Signed)
Carpal Tunnel Syndrome Carpal tunnel syndrome is a condition that causes pain in your hand and arm. The carpal tunnel is a narrow area located on the palm side of your wrist. Repeated wrist motion or certain diseases may cause swelling within the tunnel. This swelling pinches the main nerve in the wrist (median nerve). What are the causes? This condition may be caused by:  Repeated wrist motions.  Wrist injuries.  Arthritis.  A cyst or tumor in the carpal tunnel.  Fluid buildup during pregnancy.  Sometimes the cause of this condition is not known. What increases the risk? This condition is more likely to develop in:  People who have jobs that cause them to repeatedly move their wrists in the same motion, such as butchers and cashiers.  Women.  People with certain conditions, such as: ? Diabetes. ? Obesity. ? An underactive thyroid (hypothyroidism). ? Kidney failure.  What are the signs or symptoms? Symptoms of this condition include:  A tingling feeling in your fingers, especially in your thumb, index, and middle fingers.  Tingling or numbness in your hand.  An aching feeling in your entire arm, especially when your wrist and elbow are bent for long periods of time.  Wrist pain that goes up your arm to your shoulder.  Pain that goes down into your palm or fingers.  A weak feeling in your hands. You may have trouble grabbing and holding items.  Your symptoms may feel worse during the night. How is this diagnosed? This condition is diagnosed with a medical history and physical exam. You may also have tests, including:  An electromyogram (EMG). This test measures electrical signals sent by your nerves into the muscles.  X-rays.  How is this treated? Treatment for this condition includes:  Lifestyle changes. It is important to stop doing or modify the activity that caused your condition.  Physical or occupational therapy.  Medicines for pain and inflammation.  This may include medicine that is injected into your wrist.  A wrist splint.  Surgery.  Follow these instructions at home: If you have a splint:  Wear it as told by your health care provider. Remove it only as told by your health care provider.  Loosen the splint if your fingers become numb and tingle, or if they turn cold and blue.  Keep the splint clean and dry. General instructions  Take over-the-counter and prescription medicines only as told by your health care provider.  Rest your wrist from any activity that may be causing your pain. If your condition is work related, talk to your employer about changes that can be made, such as getting a wrist pad to use while typing.  If directed, apply ice to the painful area: ? Put ice in a plastic bag. ? Place a towel between your skin and the bag. ? Leave the ice on for 20 minutes, 2-3 times per day.  Keep all follow-up visits as told by your health care provider. This is important.  Do any exercises as told by your health care provider, physical therapist, or occupational therapist. Contact a health care provider if:  You have new symptoms.  Your pain is not controlled with medicines.  Your symptoms get worse. This information is not intended to replace advice given to you by your health care provider. Make sure you discuss any questions you have with your health care provider. Document Released: 12/31/1999 Document Revised: 05/13/2015 Document Reviewed: 05/20/2014 Elsevier Interactive Patient Education  2018 [employer]vier Inc[employer]  

## 2017-01-17 NOTE — Progress Notes (Signed)
Pt c/o right hand numbness.

## 2017-01-25 ENCOUNTER — Telehealth: Payer: Self-pay

## 2017-01-25 NOTE — Telephone Encounter (Signed)
Returned call, pt had questions about fetal hiccups and movements, pt stated that she is worried because she has been "googling" a lot and is getting herself "worked upPublix" Advised of sign/symptoms of preterm labor and possible fetal distress, and [facility] if experiencing any.

## 2017-01-30 ENCOUNTER — Ambulatory Visit (HOSPITAL_COMMUNITY): Payer: Medicaid Other

## 2017-01-30 ENCOUNTER — Other Ambulatory Visit: Payer: Self-pay | Admitting: Obstetrics and Gynecology

## 2017-01-30 ENCOUNTER — Ambulatory Visit (HOSPITAL_COMMUNITY)
Admission: RE | Admit: 2017-01-30 | Discharge: 2017-01-30 | Disposition: A | Discharge status: Home / Self Care | Payer: Medicaid Other | Source: Ambulatory Visit | Attending: Obstetrics and Gynecology | Admitting: Obstetrics and Gynecology

## 2017-01-30 ENCOUNTER — Encounter (HOSPITAL_COMMUNITY): Payer: Self-pay

## 2017-01-30 DIAGNOSIS — Z3A33 33 weeks gestation of pregnancy: Secondary | ICD-10-CM

## 2017-01-30 DIAGNOSIS — Z362 Encounter for other antenatal screening follow-up: Secondary | ICD-10-CM

## 2017-01-30 DIAGNOSIS — O09529 Supervision of elderly multigravida, unspecified trimester: Secondary | ICD-10-CM

## 2017-01-30 DIAGNOSIS — O99333 Smoking (tobacco) complicating pregnancy, third trimester: Secondary | ICD-10-CM

## 2017-01-30 DIAGNOSIS — Z3480 Encounter for supervision of other normal pregnancy, unspecified trimester: Secondary | ICD-10-CM

## 2017-01-31 ENCOUNTER — Encounter: Payer: Medicaid Other | Admitting: Obstetrics & Gynecology

## 2017-01-31 ENCOUNTER — Ambulatory Visit (INDEPENDENT_AMBULATORY_CARE_PROVIDER_SITE_OTHER): Payer: Medicaid Other | Admitting: Obstetrics

## 2017-01-31 ENCOUNTER — Encounter: Payer: Self-pay | Admitting: Obstetrics

## 2017-01-31 VITALS — BP 117/63 | HR 91 | Wt 164.8 lb

## 2017-01-31 DIAGNOSIS — O09529 Supervision of elderly multigravida, unspecified trimester: Secondary | ICD-10-CM

## 2017-01-31 NOTE — Progress Notes (Signed)
Subjective:  Jordan Lucas is a 37 y.o. [id number] at 8123w1d being seen today for ongoing prenatal care.  She is currently monitored for the following issues for this high-risk pregnancy and has Supervision of normal pregnancy, antepartum; Antepartum multigravida of advanced maternal age; and Chlamydia infection affecting pregnancy on their problem list.  Patient reports no complaints.  Contractions: Not present. Vag. Bleeding: None.  Movement: Present. Denies leaking of fluid.   The following portions of the patient's history were reviewed and updated as appropriate: allergies, current medications, past family history, past medical history, past social history, past surgical history and problem list. Problem list updated.  Objective:   Vitals:   01/31/17 1540  BP: 117/63  Pulse: 91  Weight: 164 lb 12.8 oz (74.8 kg)    Fetal Status: Fetal Heart Rate (bpm): 140   Movement: Present     General:  Alert, oriented and cooperative. Patient is in no acute distress.  Skin: Skin is warm and dry. No rash noted.   Cardiovascular: Normal heart rate noted  Respiratory: Normal respiratory effort, no problems with respiration noted  Abdomen: Soft, gravid, appropriate for gestational age. Pain/Pressure: Present     Pelvic:  Cervical exam deferred        Extremities: Normal range of motion.  Edema: None  Mental Status: Normal mood and affect. Normal behavior. Normal judgment and thought content.   Urinalysis:      Assessment and Plan:  Pregnancy: G4P1021 at 4723w1d  1. Supervision of other normal pregnancy, antepartum.  Advanced Maternal Age. - doing well - normal 3rd trimester U/S for growth  Preterm labor symptoms and general obstetric precautions including but not limited to vaginal bleeding, contractions, leaking of fluid and fetal movement were reviewed in detail with the patient. Please refer to After Visit Summary for other counseling recommendations.  Return in about 2 weeks (around 02/14/2017)  for ROB.   Brock BadHarper, Anderson Middlebrooks A, MD

## 2017-02-01 ENCOUNTER — Encounter (HOSPITAL_COMMUNITY): Payer: Self-pay | Admitting: *Deleted

## 2017-02-01 ENCOUNTER — Telehealth: Payer: Self-pay

## 2017-02-01 ENCOUNTER — Inpatient Hospital Stay (HOSPITAL_COMMUNITY)
Admission: AD | Admit: 2017-02-01 | Discharge: 2017-02-01 | Disposition: A | Discharge status: Home / Self Care | Payer: Medicaid Other | Source: Ambulatory Visit | Attending: Obstetrics & Gynecology | Admitting: Obstetrics & Gynecology

## 2017-02-01 DIAGNOSIS — O99333 Smoking (tobacco) complicating pregnancy, third trimester: Secondary | ICD-10-CM | POA: Insufficient documentation

## 2017-02-01 DIAGNOSIS — O368130 Decreased fetal movements, third trimester, not applicable or unspecified: Secondary | ICD-10-CM

## 2017-02-01 DIAGNOSIS — Z3A33 33 weeks gestation of pregnancy: Secondary | ICD-10-CM | POA: Diagnosis not present

## 2017-02-01 DIAGNOSIS — O09523 Supervision of elderly multigravida, third trimester: Secondary | ICD-10-CM | POA: Diagnosis not present

## 2017-02-01 DIAGNOSIS — F17210 Nicotine dependence, cigarettes, uncomplicated: Secondary | ICD-10-CM | POA: Insufficient documentation

## 2017-02-01 DIAGNOSIS — Z3689 Encounter for other specified antenatal screening: Secondary | ICD-10-CM

## 2017-02-01 NOTE — MAU Provider Note (Signed)
History   [id number]   Chief Complaint  Patient presents with  . Decreased Fetal Movement    HPI Jordan Lucas is a 37 y.o. female  [phone] here with report of decreased fetal movement since this morning. States she hasn't felt baby move since 2 pm today. States she has felt movement return since being in MAU. Denies abdominal pain, vaginal bleeding, or LOF.   Patient's last menstrual period was 05/25/2016 (approximate).  OB History  Gravida Para Term Preterm AB Living  4 1 1   2 1   SAB TAB Ectopic Multiple Live Births  0 2     1    # Outcome Date GA Lbr Len/2nd Weight Sex Delivery Anes PTL Lv  4 Current           3 Term 04/01/00 3928w4d  6 lb 2 oz (2.778 kg) F Vag-Spont   LIV  2 TAB  4131w0d         1 TAB  776w0d             Past Medical History:  Diagnosis Date  . Medical history non-contributory     History reviewed. No pertinent family history.  Social History   Socioeconomic History  . Marital status: Single    Spouse name: None  . Number of children: None  . Years of education: None  . Highest education level: None  Social Needs  . Financial resource strain: None  . Food insecurity - worry: None  . Food insecurity - inability: None  . Transportation needs - medical: None  . Transportation needs - non-medical: None  Occupational History  . None  Tobacco Use  . Smoking status: Current Every Day Smoker    Types: Cigarettes  . Smokeless tobacco: Never Used  Substance and Sexual Activity  . Alcohol use: Yes    Comment: none  . Drug use: Yes    Types: Marijuana    Comment: none since early preg  . Sexual activity: Not Currently    Birth control/protection: None  Other Topics Concern  . None  Social History Narrative  . None    No Known Allergies  No current facility-administered medications on file prior to encounter.    Current Outpatient Medications on File Prior to Encounter  Medication Sig Dispense Refill  . Clobetasol Prop Emollient Base 0.05 %  emollient cream Apply twice daily to affected area 30 g 0  . cyclobenzaprine (FLEXERIL) 10 MG tablet Take 1 tablet (10 mg total) every 8 (eight) hours as needed by mouth for muscle spasms. 30 tablet 1  . ipratropium (ATROVENT) 0.06 % nasal spray Place 2 sprays into both nostrils 4 (four) times daily. 15 mL 0  . metroNIDAZOLE (METROGEL) 0.75 % vaginal gel Place 1 Applicatorful vaginally at bedtime. Apply one applicatorful to vagina at bedtime for 5 days 70 g 1  . Prenat-FeAsp-Meth-FA-DHA w/o A (PRENATE PIXIE) 10-0.6-0.4-200 MG CAPS Take 1 tablet by mouth daily. 30 capsule 12  . Prenatal Vit-Fe Fumarate-FA (PRENATAL VITAMIN PO) Take by mouth.       Review of Systems  Gastrointestinal: Negative.   Genitourinary: Negative.    Physical Exam   Vitals:   02/01/17 1802 02/01/17 1831  BP: 119/64 114/81  Pulse: 87 88  Resp: 18 16  Temp: 97.8 F (36.6 C)     Physical Exam  Nursing note and vitals reviewed. Constitutional: She is oriented to person, place, and time. She appears well-developed and well-nourished. No distress.  HENT:  Head:  Normocephalic and atraumatic.  Eyes: Conjunctivae are normal. Right eye exhibits no discharge. Left eye exhibits no discharge. No scleral icterus.  Neck: Normal range of motion.  Respiratory: Effort normal. No respiratory distress.  GI: Soft. There is no tenderness.  Neurological: She is alert and oriented to person, place, and time.  Skin: Skin is warm and dry. She is not diaphoretic.  Psychiatric: She has a normal mood and affect. Her behavior is normal. Judgment and thought content normal.    MAU Course  Procedures No results found for this or any previous visit (from the past 24 hour(s)).  MDM NST:  Baseline: 135 bpm, Variability: Good {> 6 bpm), Accelerations: Reactive and Decelerations: Absent Reactive NST Pt reports return of fetal movement & FM palpated & heard on monitor  Assessment and Plan  A: 1. Decreased fetal movements in third  trimester, single or unspecified fetus   2. NST (non-stress test) reactive   3. [redacted] weeks gestation of pregnancy    P: Discharge home Fetal movement form given Discussed reasons to return to MAU Keep f/u with OB   Judeth Horn, NP 02/01/2017 6:09 PM

## 2017-02-01 NOTE — Telephone Encounter (Signed)
Patient called with c/o no fetal movement, [facility]

## 2017-02-01 NOTE — MAU Note (Signed)
Pt has not felt baby move since this morning.Reports a little pain on her left side.

## 2017-02-01 NOTE — Discharge Instructions (Signed)

## 2017-02-14 ENCOUNTER — Ambulatory Visit (INDEPENDENT_AMBULATORY_CARE_PROVIDER_SITE_OTHER): Payer: Medicaid Other | Admitting: Obstetrics

## 2017-02-14 ENCOUNTER — Encounter: Payer: Self-pay | Admitting: Obstetrics

## 2017-02-14 VITALS — BP 127/76 | HR 93 | Wt 170.0 lb

## 2017-02-14 DIAGNOSIS — O26899 Other specified pregnancy related conditions, unspecified trimester: Secondary | ICD-10-CM

## 2017-02-14 DIAGNOSIS — O368130 Decreased fetal movements, third trimester, not applicable or unspecified: Secondary | ICD-10-CM | POA: Diagnosis not present

## 2017-02-14 DIAGNOSIS — Z3483 Encounter for supervision of other normal pregnancy, third trimester: Secondary | ICD-10-CM

## 2017-02-14 DIAGNOSIS — G5600 Carpal tunnel syndrome, unspecified upper limb: Secondary | ICD-10-CM

## 2017-02-14 DIAGNOSIS — O26893 Other specified pregnancy related conditions, third trimester: Secondary | ICD-10-CM

## 2017-02-14 DIAGNOSIS — Z3490 Encounter for supervision of normal pregnancy, unspecified, unspecified trimester: Secondary | ICD-10-CM

## 2017-02-14 MED ORDER — CASTS AND SPLINTS MISC: 2.0000 | Freq: Every day | 0 refills | Status: DC

## 2017-02-14 NOTE — Progress Notes (Signed)
Subjective:  Jordan Lucas is a 37 y.o. [id number] at 5122w1d being seen today for ongoing prenatal care.  She is currently monitored for the following issues for this high-risk pregnancy and has Supervision of normal pregnancy, antepartum; Antepartum multigravida of advanced maternal age; and Chlamydia infection affecting pregnancy on their problem list.  Patient reports carpal tunnel symptoms.  Contractions: Not present. Vag. Bleeding: None.  Movement: (!) Decreased. Denies leaking of fluid.   The following portions of the patient's history were reviewed and updated as appropriate: allergies, current medications, past family history, past medical history, past social history, past surgical history and problem list. Problem list updated.  Objective:   Vitals:   02/14/17 1612  BP: 127/76  Pulse: 93  Weight: 170 lb (77.1 kg)    Fetal Status: Fetal Heart Rate (bpm): 140's   Movement: (!) Decreased     General:  Alert, oriented and cooperative. Patient is in no acute distress.  Skin: Skin is warm and dry. No rash noted.   Cardiovascular: Normal heart rate noted  Respiratory: Normal respiratory effort, no problems with respiration noted  Abdomen: Soft, gravid, appropriate for gestational age. Pain/Pressure: Present     Pelvic:  Cervical exam deferred        Extremities: Normal range of motion.  Edema: None  Mental Status: Normal mood and affect. Normal behavior. Normal judgment and thought content.   Urinalysis:       Assessment and Plan:  Pregnancy: G4P1021 at 4722w1d  1. Encounter for supervision of normal pregnancy, antepartum, unspecified gravidity  2. Decreased fetal movements in third trimester, single or unspecified fetus Rx: - Fetal nonstress test:  Reactive.  Good variability.  Baseline FHR 140's.  15x15 accels.  No decels.  3. Carpal tunnel syndrome during pregnancy Rx: - Wrist Splints MISC; 2 Devices.  Wear as directed daily.  Dispense: 2 Device; Refill: 0  Preterm labor  symptoms and general obstetric precautions including but not limited to vaginal bleeding, contractions, leaking of fluid and fetal movement were reviewed in detail with the patient. Please refer to After Visit Summary for other counseling recommendations.  Return in about 1 week (around 02/21/2017) for ROB.   Brock BadHarper, Charles A, MD

## 2017-02-14 NOTE — Progress Notes (Signed)
Pt c/o: carpel tunnel wants Rx sent  Causing pt to have insomnia.

## 2017-02-22 ENCOUNTER — Other Ambulatory Visit (HOSPITAL_COMMUNITY)
Admission: RE | Admit: 2017-02-22 | Discharge: 2017-02-22 | Disposition: A | Discharge status: Home / Self Care | Payer: Medicaid Other | Source: Ambulatory Visit | Attending: Certified Nurse Midwife | Admitting: Certified Nurse Midwife

## 2017-02-22 ENCOUNTER — Encounter: Payer: Self-pay | Admitting: Certified Nurse Midwife

## 2017-02-22 ENCOUNTER — Ambulatory Visit (INDEPENDENT_AMBULATORY_CARE_PROVIDER_SITE_OTHER): Payer: Medicaid Other | Admitting: Certified Nurse Midwife

## 2017-02-22 VITALS — BP 119/74 | HR 88 | Wt 171.0 lb

## 2017-02-22 DIAGNOSIS — Z3480 Encounter for supervision of other normal pregnancy, unspecified trimester: Secondary | ICD-10-CM

## 2017-02-22 DIAGNOSIS — O09529 Supervision of elderly multigravida, unspecified trimester: Secondary | ICD-10-CM

## 2017-02-22 DIAGNOSIS — O09523 Supervision of elderly multigravida, third trimester: Secondary | ICD-10-CM | POA: Insufficient documentation

## 2017-02-22 DIAGNOSIS — Z3483 Encounter for supervision of other normal pregnancy, third trimester: Secondary | ICD-10-CM | POA: Diagnosis present

## 2017-02-22 DIAGNOSIS — Z3A36 36 weeks gestation of pregnancy: Secondary | ICD-10-CM | POA: Insufficient documentation

## 2017-02-22 LAB — OB RESULTS CONSOLE GC/CHLAMYDIA: Gonorrhea: NEGATIVE

## 2017-02-22 LAB — OB RESULTS CONSOLE GBS: GBS: POSITIVE

## 2017-02-22 NOTE — Progress Notes (Signed)
   PRENATAL VISIT NOTE  Subjective:  Jordan Lucas is a 37 y.o. [id number] at 1667w2d being seen today for ongoing prenatal care.  She is currently monitored for the following issues for this low-risk pregnancy and has Supervision of normal pregnancy, antepartum; Antepartum multigravida of advanced maternal age; and Chlamydia infection affecting pregnancy on their problem list.  Patient reports no complaints.  Contractions: Not present. Vag. Bleeding: None.  Movement: Present. Denies leaking of fluid.   The following portions of the patient's history were reviewed and updated as appropriate: allergies, current medications, past family history, past medical history, past social history, past surgical history and problem list. Problem list updated.  Objective:   Vitals:   02/22/17 1535  BP: 119/74  Pulse: 88  Weight: 171 lb (77.6 kg)    Fetal Status: Fetal Heart Rate (bpm): 144; doppler Fundal Height: 34 cm Movement: Present  Presentation: Vertex  General:  Alert, oriented and cooperative. Patient is in no acute distress.  Skin: Skin is warm and dry. No rash noted.   Cardiovascular: Normal heart rate noted  Respiratory: Normal respiratory effort, no problems with respiration noted  Abdomen: Soft, gravid, appropriate for gestational age.  Pain/Pressure: Present     Pelvic: Cervical exam performed Dilation: 1 Effacement (%): Thick Station: -3  Extremities: Normal range of motion.  Edema: None  Mental Status:  Normal mood and affect. Normal behavior. Normal judgment and thought content.   Assessment and Plan:  Pregnancy: G4P1021 at 2067w2d  1. Supervision of other normal pregnancy, antepartum     Doing well. GBS and cultures today.   2. Antepartum multigravida of advanced maternal age     <40 years.   Preterm labor symptoms and general obstetric precautions including but not limited to vaginal bleeding, contractions, leaking of fluid and fetal movement were reviewed in detail with the  patient. Please refer to After Visit Summary for other counseling recommendations.  Return in about 1 week (around 03/01/2017) for ROB.   Roe Coombsachelle A Nayra Coury, CNM

## 2017-02-22 NOTE — Progress Notes (Signed)
Pt denies concerns at this time. 

## 2017-02-23 LAB — CERVICOVAGINAL ANCILLARY ONLY
CHLAMYDIA, DNA PROBE: NEGATIVE
NEISSERIA GONORRHEA: NEGATIVE
Trichomonas: NEGATIVE

## 2017-02-24 LAB — STREP GP B NAA: STREP GROUP B AG: POSITIVE — AB

## 2017-03-01 ENCOUNTER — Other Ambulatory Visit: Payer: Self-pay | Admitting: Certified Nurse Midwife

## 2017-03-01 ENCOUNTER — Encounter: Payer: Self-pay | Admitting: Certified Nurse Midwife

## 2017-03-01 ENCOUNTER — Ambulatory Visit (INDEPENDENT_AMBULATORY_CARE_PROVIDER_SITE_OTHER): Payer: Medicaid Other | Admitting: Certified Nurse Midwife

## 2017-03-01 VITALS — BP 114/75 | HR 92 | Wt 170.9 lb

## 2017-03-01 DIAGNOSIS — Z3480 Encounter for supervision of other normal pregnancy, unspecified trimester: Secondary | ICD-10-CM

## 2017-03-01 DIAGNOSIS — O09529 Supervision of elderly multigravida, unspecified trimester: Secondary | ICD-10-CM

## 2017-03-01 DIAGNOSIS — B951 Streptococcus, group B, as the cause of diseases classified elsewhere: Secondary | ICD-10-CM

## 2017-03-01 NOTE — Progress Notes (Signed)
   PRENATAL VISIT NOTE  Subjective:  Jordan Lucas is a 37 y.o. [id number] at 5234w2d being seen today for ongoing prenatal care.  She is currently monitored for the following issues for this low-risk pregnancy and has Supervision of normal pregnancy, antepartum; Antepartum multigravida of advanced maternal age; Chlamydia infection affecting pregnancy; and Positive GBS test on their problem list.  Patient reports no complaints.  Contractions: Not present. Vag. Bleeding: None.  Movement: Present. Denies leaking of fluid.   The following portions of the patient's history were reviewed and updated as appropriate: allergies, current medications, past family history, past medical history, past social history, past surgical history and problem list. Problem list updated.  Objective:   Vitals:   03/01/17 1609  BP: 114/75  Pulse: 92  Weight: 170 lb 14.4 oz (77.5 kg)    Fetal Status: Fetal Heart Rate (bpm): 142; doppler Fundal Height: 36 cm Movement: Present     General:  Alert, oriented and cooperative. Patient is in no acute distress.  Skin: Skin is warm and dry. No rash noted.   Cardiovascular: Normal heart rate noted  Respiratory: Normal respiratory effort, no problems with respiration noted  Abdomen: Soft, gravid, appropriate for gestational age.  Pain/Pressure: Present     Pelvic: Cervical exam deferred        Extremities: Normal range of motion.  Edema: None  Mental Status:  Normal mood and affect. Normal behavior. Normal judgment and thought content.   Assessment and Plan:  Pregnancy: G4P1021 at 8334w2d  1. Supervision of other normal pregnancy, antepartum     Doing well  2. Positive GBS test     PCN for labor/delivery  3. Antepartum multigravida of advanced maternal age     <40 years  Term labor symptoms and general obstetric precautions including but not limited to vaginal bleeding, contractions, leaking of fluid and fetal movement were reviewed in detail with the patient. Please  refer to After Visit Summary for other counseling recommendations.  Return in about 1 week (around 03/08/2017) for ROB.   Roe Coombsachelle A Adnan Vanvoorhis, CNM

## 2017-03-08 ENCOUNTER — Inpatient Hospital Stay (HOSPITAL_COMMUNITY)
Admission: AD | Admit: 2017-03-08 | Discharge: 2017-03-11 | DRG: 788 | Disposition: A | Discharge status: Home / Self Care | Payer: Medicaid Other | Source: Ambulatory Visit | Attending: Obstetrics & Gynecology | Admitting: Obstetrics & Gynecology

## 2017-03-08 ENCOUNTER — Encounter: Payer: Self-pay | Admitting: Certified Nurse Midwife

## 2017-03-08 ENCOUNTER — Ambulatory Visit (INDEPENDENT_AMBULATORY_CARE_PROVIDER_SITE_OTHER): Payer: Medicaid Other | Admitting: Certified Nurse Midwife

## 2017-03-08 ENCOUNTER — Encounter (HOSPITAL_COMMUNITY): Payer: Self-pay

## 2017-03-08 VITALS — BP 128/80 | HR 93 | Wt 171.8 lb

## 2017-03-08 DIAGNOSIS — Z3A38 38 weeks gestation of pregnancy: Secondary | ICD-10-CM | POA: Diagnosis not present

## 2017-03-08 DIAGNOSIS — B951 Streptococcus, group B, as the cause of diseases classified elsewhere: Secondary | ICD-10-CM

## 2017-03-08 DIAGNOSIS — Z98891 History of uterine scar from previous surgery: Secondary | ICD-10-CM

## 2017-03-08 DIAGNOSIS — Z9882 Breast implant status: Secondary | ICD-10-CM

## 2017-03-08 DIAGNOSIS — Z3480 Encounter for supervision of other normal pregnancy, unspecified trimester: Secondary | ICD-10-CM

## 2017-03-08 DIAGNOSIS — O99824 Streptococcus B carrier state complicating childbirth: Secondary | ICD-10-CM | POA: Diagnosis present

## 2017-03-08 DIAGNOSIS — L309 Dermatitis, unspecified: Secondary | ICD-10-CM

## 2017-03-08 DIAGNOSIS — Z87891 Personal history of nicotine dependence: Secondary | ICD-10-CM | POA: Diagnosis not present

## 2017-03-08 DIAGNOSIS — O09529 Supervision of elderly multigravida, unspecified trimester: Secondary | ICD-10-CM

## 2017-03-08 DIAGNOSIS — O368390 Maternal care for abnormalities of the fetal heart rate or rhythm, unspecified trimester, not applicable or unspecified: Secondary | ICD-10-CM | POA: Diagnosis present

## 2017-03-08 LAB — CBC
HCT: 33.6 % — ABNORMAL LOW (ref 36.0–46.0)
Hemoglobin: 11.4 g/dL — ABNORMAL LOW (ref 12.0–15.0)
MCH: 32.4 pg (ref 26.0–34.0)
MCHC: 33.9 g/dL (ref 30.0–36.0)
MCV: 95.5 fL (ref 78.0–100.0)
PLATELETS: 176 10*3/uL (ref 150–400)
RBC: 3.52 MIL/uL — ABNORMAL LOW (ref 3.87–5.11)
RDW: 13.7 % (ref 11.5–15.5)
WBC: 7.2 10*3/uL (ref 4.0–10.5)

## 2017-03-08 LAB — TYPE AND SCREEN
ABO/RH(D): B POS
Antibody Screen: NEGATIVE

## 2017-03-08 MED ORDER — TRIAMCINOLONE ACETONIDE 0.5 % EX OINT: 1.0000 "application " | TOPICAL_OINTMENT | Freq: Two times a day (BID) | CUTANEOUS | 0 refills | Status: DC

## 2017-03-08 MED ORDER — SOD CITRATE-CITRIC ACID 500-334 MG/5ML PO SOLN
30.0000 mL | ORAL | Status: DC | PRN
  Administered 2017-03-08 – 2017-03-09 (×2): 30 mL via ORAL
  Filled 2017-03-08 (×2): qty 15

## 2017-03-08 MED ORDER — LACTATED RINGERS IV SOLN
500.0000 mL | INTRAVENOUS | Status: DC | PRN
  Administered 2017-03-09: 500 mL via INTRAVENOUS

## 2017-03-08 MED ORDER — OXYTOCIN BOLUS FROM INFUSION: 500.0000 mL | Freq: Once | INTRAVENOUS | Status: DC

## 2017-03-08 MED ORDER — SODIUM CHLORIDE 0.9 % IV SOLN
5.0000 10*6.[IU] | Freq: Once | INTRAVENOUS | Status: AC
  Administered 2017-03-08: 5 10*6.[IU] via INTRAVENOUS
  Filled 2017-03-08: qty 5

## 2017-03-08 MED ORDER — ZOLPIDEM TARTRATE 5 MG PO TABS
5.0000 mg | ORAL_TABLET | Freq: Every evening | ORAL | Status: DC | PRN
  Administered 2017-03-08: 5 mg via ORAL
  Filled 2017-03-08: qty 1

## 2017-03-08 MED ORDER — ONDANSETRON HCL 4 MG/2ML IJ SOLN
4.0000 mg | Freq: Four times a day (QID) | INTRAMUSCULAR | Status: DC | PRN
  Administered 2017-03-09: 4 mg via INTRAVENOUS

## 2017-03-08 MED ORDER — LIDOCAINE HCL (PF) 1 % IJ SOLN
30.0000 mL | INTRAMUSCULAR | Status: DC | PRN
  Filled 2017-03-08 (×2): qty 30

## 2017-03-08 MED ORDER — FLEET ENEMA 7-19 GM/118ML RE ENEM: 1.0000 | ENEMA | RECTAL | Status: DC | PRN

## 2017-03-08 MED ORDER — FENTANYL CITRATE (PF) 100 MCG/2ML IJ SOLN
100.0000 ug | INTRAMUSCULAR | Status: DC | PRN
  Administered 2017-03-09: 100 ug via INTRAVENOUS
  Filled 2017-03-08: qty 2

## 2017-03-08 MED ORDER — OXYCODONE-ACETAMINOPHEN 5-325 MG PO TABS: 2.0000 | ORAL_TABLET | ORAL | Status: DC | PRN

## 2017-03-08 MED ORDER — ACETAMINOPHEN 325 MG PO TABS: 650.0000 mg | ORAL_TABLET | ORAL | Status: DC | PRN

## 2017-03-08 MED ORDER — OXYCODONE-ACETAMINOPHEN 5-325 MG PO TABS: 1.0000 | ORAL_TABLET | ORAL | Status: DC | PRN

## 2017-03-08 MED ORDER — LACTATED RINGERS IV SOLN
INTRAVENOUS | Status: DC
  Administered 2017-03-08 – 2017-03-09 (×7): via INTRAVENOUS

## 2017-03-08 MED ORDER — PENICILLIN G POT IN DEXTROSE 60000 UNIT/ML IV SOLN
3.0000 10*6.[IU] | INTRAVENOUS | Status: DC
  Administered 2017-03-08 – 2017-03-09 (×2): 3 10*6.[IU] via INTRAVENOUS
  Filled 2017-03-08 (×5): qty 50

## 2017-03-08 MED ORDER — OXYTOCIN 40 UNITS IN LACTATED RINGERS INFUSION - SIMPLE MED
2.5000 [IU]/h | INTRAVENOUS | Status: DC
  Filled 2017-03-08 (×2): qty 1000

## 2017-03-08 NOTE — Progress Notes (Addendum)
Vitals:   03/08/17 2136 03/08/17 2140  BP: 133/84   Pulse: 76   Resp:  18  Temp:  98.7 F (37.1 C)  SpO2:     Foley fell out and shortly after pt had SROM w/clear fluid. Started contracting soon after FB placement, getting stronger. Cx 4.5/50/-2.  FHR had a few prolonged decels (to 80's for 2-3 minutes) which responded to position change.  Pt may be starting labor, so will hold off on pitocin for right now, start it if ctx don't continue to get stronger/more regular.

## 2017-03-08 NOTE — Progress Notes (Signed)
Pt requests rf for Clobetasol cream for eczema and cx check today d/t increased vaginal pressure.

## 2017-03-08 NOTE — MAU Note (Signed)
Patient sent from office by Orvilla Cornwallachelle Denney, CNM for prolonged deceleration. Placed on Fetal heart monitor and contraction monitor. Continuing to assess. Jordan Lucas, [place] 03/08/2017 3:40 PM

## 2017-03-08 NOTE — H&P (Signed)
OBSTETRIC ADMISSION HISTORY AND PHYSICAL  Jordan Lucas is a 37 y.o. female [phone] with IUP at 5170w2d by LMP presenting for IOL for NRFHT. She was seen in the office this afternoon where the CNM auscultated a deceleration with the doppler and sent her to MAU for monitoring. In MAU, she had a 3 minute prolonged deceleration. She reports +FMs, No LOF, no VB, no blurry vision, headaches or peripheral edema, and RUQ pain.  She plans on breast feeding. She request IUD for birth control. [facility]  Dating: By LMP --->  Estimated Date of Delivery: 03/20/17  Clinic   [facility] Prenatal Labs  Dating  LMP Blood type: B/Positive/-- (09/04 1610)   Genetic Screen 1 Screen:    AFP:     Quad:  negative   NIPS: Antibody:Negative (09/04 1610)  Anatomic US  Normal Rubella: 2.93 (09/04 1610)  GTT Third trimester: nl 2 hour RPR: Non Reactive (11/27 1136)   Flu vaccine   Declined 09/19/16 HBsAg: Negative (09/04 1610)   TDaP vaccine    Pt declines   12/12/16             Rhogam:n/a B+ HIV: Non Reactive (11/27 1136) NR   Baby Food  Bottle                                      AVW:UJWJXBJYGBS:Positive (02/07 1633)  Contraception   Undecided Pap:neg cytology pos HPV, repeat 1 year  Circumcision   Yes if boy   Pediatrician [facility]: NEG  Support Person    Mother & daughter SMA  Prenatal Classes     Undecided Hgb electrophoresis:   Prenatal History/Complications:  Past Medical History: Past Medical History:  Diagnosis Date  . Medical history non-contributory     Past Surgical History: Past Surgical History:  Procedure Laterality Date  . BREAST ENHANCEMENT SURGERY    . LIPOSUCTION      Obstetrical History: OB History    Gravida Para Term Preterm AB Living   5 1 1   3 1    SAB TAB Ectopic Multiple Live Births   0 3     1      Social History: Social History   Socioeconomic History  . Marital status: Single    Spouse name: None  . Number of children: None  . Years of  education: None  . Highest education level: None  Social Needs  . Financial resource strain: None  . Food insecurity - worry: None  . Food insecurity - inability: None  . Transportation needs - medical: None  . Transportation needs - non-medical: None  Occupational History  . None  Tobacco Use  . Smoking status: Former Smoker    Types: Cigarettes  . Smokeless tobacco: Former NeurosurgeonUser    Quit date: 02/17/2016  Substance and Sexual Activity  . Alcohol use: Yes    Comment: none  . Drug use: Yes    Types: Marijuana    Comment: none since early preg  . Sexual activity: Not Currently    Birth control/protection: None  Other Topics Concern  . None  Social History Narrative  . None    Family History: Family History  Problem Relation Age of Onset  . Hypertension Mother   . Asthma Brother   . Heart disease Maternal Grandmother   . Hypertension Maternal Grandmother   . Diabetes Paternal Grandmother  Allergies: No Known Allergies  Medications Prior to Admission  Medication Sig Dispense Refill Last Dose  . acetaminophen (TYLENOL) 500 MG tablet Take 1,000 mg by mouth every 6 (six) hours as needed for mild pain or headache.   Past Week at Unknown time  . calcium carbonate (TUMS - DOSED IN MG ELEMENTAL CALCIUM) 500 MG chewable tablet Chew 1 tablet by mouth 2 (two) times daily as needed for indigestion or heartburn.   Past Week at Unknown time  . Prenat-FeAsp-Meth-FA-DHA w/o A (PRENATE PIXIE) 10-0.6-0.4-200 MG CAPS Take 1 tablet by mouth daily. 30 capsule 12 Past Week at Unknown time  . triamcinolone ointment (KENALOG) 0.5 % Apply 1 application topically 2 (two) times daily. 30 g 0 Past Month at Unknown time   Review of Systems   All systems reviewed and negative except as stated in HPI  Blood pressure 119/85, pulse 91, temperature (!) 97.5 F (36.4 C), temperature source Oral, resp. rate 18, height 5\' 7"  (1.702 m), weight 171 lb (77.6 kg), last menstrual period 05/25/2016, SpO2 99  %. General appearance: alert and no distress Lungs: clear to auscultation bilaterally Heart: regular rate and rhythm Abdomen: soft, non-tender; bowel sounds normal Pelvic: n/a Extremities: Homans sign is negative, no sign of DVT DTR's +2 Presentation: cephalic Fetal monitoringBaseline: 145 bpm, Variability: Good {> 6 bpm), Accelerations: Reactive and Decelerations: Absent Uterine activityNone Dilation: 1 Effacement (%): Thick Station: -3 Exam by:: Mary Swaz[place] Johnson, RN    Prenatal labs: ABO, Rh: --/--/B POS (02/21 1625) Antibody: PENDING (02/21 1625) Rubella: 2.93 (09/04 1610) RPR: Non Reactive (11/27 1136)  HBsAg: Negative (09/04 1610)  HIV: Non Reactive (11/27 1136)  GBS: Positive (02/07 1633)   Prenatal Transfer Tool  Maternal Diabetes: No Genetic Screening: Normal Maternal Ultrasounds/Referrals: Normal Fetal Ultrasounds or other Referrals:  None Maternal Substance Abuse:  No Significant Maternal Medications:  None Significant Maternal Lab Results: Lab values include: Group B Strep positive  Results for orders placed or performed during the hospital encounter of 03/08/17 (from the past 24 hour(s))  Type and screen [facility] [place] [facility] [place]   Collection Time: 03/08/17  4:25 PM  Result Value Ref Range   ABO/RH(D) B POS    Antibody Screen PENDING    Sample Expiration      03/11/2017 Performed at [facility], [address] [place], [place] [address]   CBC   Collection Time: 03/08/17  4:29 PM  Result Value Ref Range   WBC 7.2 4.0 - 10.5 K/uL   RBC 3.52 (L) 3.87 - 5.11 MIL/uL   Hemoglobin 11.4 (L) 12.0 - 15.0 g/dL   HCT 60.4 (L) 54.0 - 98.1 %   MCV 95.5 78.0 - 100.0 fL   MCH 32.4 26.0 - 34.0 pg   MCHC 33.9 30.0 - 36.0 g/dL   RDW 19.1 47.8 - 29.5 %   Platelets 176 150 - 400 K/uL    Patient Active Problem List   Diagnosis Date Noted  . Variable fetal heart rate decelerations, antepartum 03/08/2017  . Positive GBS test 03/01/2017  . Chlamydia  infection affecting pregnancy 11/14/2016  . Supervision of normal pregnancy, antepartum 09/19/2016  . Antepartum multigravida of advanced maternal age 73/04/2016    Assessment/Plan:  Jordan Lucas is a 37 y.o. [phone] at [redacted]w[redacted]d here for IOL for NRFHT.  #Labor: Foley bulb then pitocin Risks and benefits of foley for induction discussed with patient. Patient agreeable to plan of care. Foley placed without difficulty and inflated with 60ml fluid. Patient tolerated procedure  well. #Pain: Planning IV pain medication #FWB: Cat 1 #ID:  GBS pos- PCN #MOF: Breast #MOC: IUD #Circ:  no  Rolm Bookbinder, CNM  03/08/2017, 6:09 PM

## 2017-03-08 NOTE — Progress Notes (Signed)
   PRENATAL VISIT NOTE  Subjective:  Jordan Lucas is a 37 y.o. [id number] at [redacted]w[redacted]d being seen today for ongoing prenatal care.  She is currently monitored for the following issues for this low-risk pregnancy and has Supervision of normal pregnancy, antepartum; Antepartum multigravida of advanced maternal age; Chlamydia infection affecting pregnancy; and Positive GBS test on their problem list.  Patient reports no complaints.  Contractions: Irregular. Vag. Bleeding: None.  Movement: Present. Denies leaking of fluid.   The following portions of the patient's history were reviewed and updated as appropriate: allergies, current medications, past family history, past medical history, past social history, past surgical history and problem list. Problem list updated.  Objective:   Vitals:   03/08/17 1445  BP: 128/80  Pulse: 93  Weight: 171 lb 12.8 oz (77.9 kg)    Fetal Status: Fetal Heart Rate (bpm): decels Fundal Height: 37 cm Movement: Present  Presentation: Vertex  General:  Alert, oriented and cooperative. Patient is in no acute distress.  Skin: Skin is warm and dry. No rash noted.   Cardiovascular: Normal heart rate noted  Respiratory: Normal respiratory effort, no problems with respiration noted  Abdomen: Soft, gravid, appropriate for gestational age.  Pain/Pressure: Present     Pelvic: Cervical exam performed Dilation: 1 Effacement (%): Thick Station: -3  Extremities: Normal range of motion.  Edema: None  Mental Status:  Normal mood and affect. Normal behavior. Normal judgment and thought content.   Assessment and Plan:  Pregnancy: G4P1021 at 366w2d  1. Supervision of other normal pregnancy, antepartum     Fetal tachycardia noted on doppler with prolonged auscultation fetal decel noted to 80 with slow recovery to baseline, then had another decel to the 90s with slow return to baseline.  Dr. Debroah LoopArnold notified.  [facility]  [facility]  monitoring/possible admission.   2. Positive GBS test     PCN for labor/delivery.  3. Antepartum multigravida of advanced maternal age     <40 years  4. Eczema, unspecified type    - triamcinolone ointment (KENALOG) 0.5 %; Apply 1 application topically 2 (two) times daily.  Dispense: 30 g; Refill: 0  Term labor symptoms and general obstetric precautions including but not limited to vaginal bleeding, contractions, leaking of fluid and fetal movement were reviewed in detail with the patient. Please refer to After Visit Summary for other counseling recommendations.  Return in about 1 week (around 03/15/2017) for ROB: if not delivered.   Roe Coombsachelle A Shunte Senseney, CNM

## 2017-03-08 NOTE — MAU Provider Note (Signed)
[facility].  Had 3.5 minute decel in MAU w/ with good recovery and otherwise reactive tracing. Per consult w/ Dr. Ezzard StandingNewman MFM and Dr. [place] Neddick CallasHarrawy-Adleigh Mcmasters will admit for induction (if baby tolerates it.) BPP not indicated.

## 2017-03-09 ENCOUNTER — Inpatient Hospital Stay (HOSPITAL_COMMUNITY): Payer: Medicaid Other | Admitting: Anesthesiology

## 2017-03-09 ENCOUNTER — Encounter (HOSPITAL_COMMUNITY)
Admission: AD | Disposition: A | Discharge status: Home / Self Care | Payer: Self-pay | Source: Ambulatory Visit | Attending: Obstetrics & Gynecology

## 2017-03-09 ENCOUNTER — Encounter (HOSPITAL_COMMUNITY): Payer: Self-pay | Admitting: Neonatal-Perinatal Medicine

## 2017-03-09 DIAGNOSIS — O99824 Streptococcus B carrier state complicating childbirth: Secondary | ICD-10-CM

## 2017-03-09 DIAGNOSIS — Z98891 History of uterine scar from previous surgery: Secondary | ICD-10-CM

## 2017-03-09 DIAGNOSIS — Z3A38 38 weeks gestation of pregnancy: Secondary | ICD-10-CM

## 2017-03-09 LAB — CREATININE, SERUM
Creatinine, Ser: 0.51 mg/dL (ref 0.44–1.00)
GFR calc non Af Amer: >60 mL/min (ref >60)

## 2017-03-09 LAB — CBC
HCT: 29.9 % — ABNORMAL LOW (ref 36.0–46.0)
HEMOGLOBIN: 10.5 g/dL — AB (ref 12.0–15.0)
MCH: 33.3 pg (ref 26.0–34.0)
MCHC: 35.1 g/dL (ref 30.0–36.0)
MCV: 94.9 fL (ref 78.0–100.0)
Platelets: 153 10*3/uL (ref 150–400)
RBC: 3.15 MIL/uL — AB (ref 3.87–5.11)
RDW: 13.7 % (ref 11.5–15.5)
WBC: 16 10*3/uL — ABNORMAL HIGH (ref 4.0–10.5)

## 2017-03-09 LAB — RPR: RPR Ser Ql: NONREACTIVE

## 2017-03-09 SURGERY — Surgical Case
Anesthesia: Epidural | Wound class: Clean Contaminated

## 2017-03-09 MED ORDER — LACTATED RINGERS IV SOLN
INTRAVENOUS | Status: DC
  Administered 2017-03-09: 02:00:00 via INTRAUTERINE

## 2017-03-09 MED ORDER — FENTANYL 2.5 MCG/ML BUPIVACAINE 1/10 % EPIDURAL INFUSION (WH - ANES)
14.0000 mL/h | INTRAMUSCULAR | Status: DC | PRN
  Administered 2017-03-09: 14 mL/h via EPIDURAL
  Filled 2017-03-09: qty 100

## 2017-03-09 MED ORDER — CEFAZOLIN SODIUM-DEXTROSE 2-3 GM-%(50ML) IV SOLR
INTRAVENOUS | Status: DC | PRN
  Administered 2017-03-09: 2 g via INTRAVENOUS

## 2017-03-09 MED ORDER — ONDANSETRON HCL 4 MG/2ML IJ SOLN
INTRAMUSCULAR | Status: AC
  Filled 2017-03-09: qty 2

## 2017-03-09 MED ORDER — IBUPROFEN 600 MG PO TABS
600.0000 mg | ORAL_TABLET | Freq: Four times a day (QID) | ORAL | Status: DC
  Administered 2017-03-09 – 2017-03-11 (×9): 600 mg via ORAL
  Filled 2017-03-09 (×9): qty 1

## 2017-03-09 MED ORDER — ACETAMINOPHEN 160 MG/5ML PO SOLN: 325.0000 mg | ORAL | Status: DC | PRN

## 2017-03-09 MED ORDER — SODIUM CHLORIDE 0.9% FLUSH: 3.0000 mL | INTRAVENOUS | Status: DC | PRN

## 2017-03-09 MED ORDER — SENNOSIDES-DOCUSATE SODIUM 8.6-50 MG PO TABS
2.0000 | ORAL_TABLET | ORAL | Status: DC
  Administered 2017-03-09 – 2017-03-11 (×2): 2 via ORAL
  Filled 2017-03-09 (×4): qty 2

## 2017-03-09 MED ORDER — MEPERIDINE HCL 25 MG/ML IJ SOLN: 6.2500 mg | INTRAMUSCULAR | Status: DC | PRN

## 2017-03-09 MED ORDER — MENTHOL 3 MG MT LOZG
1.0000 | LOZENGE | OROMUCOSAL | Status: DC | PRN
Start: 2017-03-09 — End: 2017-03-11
  Filled 2017-03-09: qty 9

## 2017-03-09 MED ORDER — FENTANYL CITRATE (PF) 100 MCG/2ML IJ SOLN
INTRAMUSCULAR | Status: AC
  Filled 2017-03-09: qty 2

## 2017-03-09 MED ORDER — BUPIVACAINE HCL (PF) 0.5 % IJ SOLN
INTRAMUSCULAR | Status: AC
  Filled 2017-03-09: qty 30

## 2017-03-09 MED ORDER — COCONUT OIL OIL
1.0000 "application " | TOPICAL_OIL | Status: DC | PRN
  Filled 2017-03-09: qty 120

## 2017-03-09 MED ORDER — ONDANSETRON HCL 4 MG/2ML IJ SOLN: 4.0000 mg | Freq: Three times a day (TID) | INTRAMUSCULAR | Status: DC | PRN

## 2017-03-09 MED ORDER — SCOPOLAMINE 1 MG/3DAYS TD PT72
MEDICATED_PATCH | TRANSDERMAL | Status: AC
  Filled 2017-03-09: qty 1

## 2017-03-09 MED ORDER — MORPHINE SULFATE (PF) 0.5 MG/ML IJ SOLN
INTRAMUSCULAR | Status: AC
  Filled 2017-03-09: qty 10

## 2017-03-09 MED ORDER — ACETAMINOPHEN 325 MG PO TABS
650.0000 mg | ORAL_TABLET | ORAL | Status: DC | PRN
  Administered 2017-03-10 – 2017-03-11 (×4): 650 mg via ORAL
  Filled 2017-03-09 (×4): qty 2

## 2017-03-09 MED ORDER — NALBUPHINE HCL 10 MG/ML IJ SOLN: 5.0000 mg | Freq: Once | INTRAMUSCULAR | Status: DC | PRN

## 2017-03-09 MED ORDER — ENOXAPARIN SODIUM 40 MG/0.4ML ~~LOC~~ SOLN
40.0000 mg | SUBCUTANEOUS | Status: DC
  Administered 2017-03-11: 40 mg via SUBCUTANEOUS
  Filled 2017-03-09 (×3): qty 0.4

## 2017-03-09 MED ORDER — LIDOCAINE HCL (PF) 1 % IJ SOLN
INTRAMUSCULAR | Status: DC | PRN
  Administered 2017-03-09 (×2): 5 mL via EPIDURAL

## 2017-03-09 MED ORDER — PHENYLEPHRINE 40 MCG/ML (10ML) SYRINGE FOR IV PUSH (FOR BLOOD PRESSURE SUPPORT)
PREFILLED_SYRINGE | INTRAVENOUS | Status: AC
  Filled 2017-03-09: qty 10

## 2017-03-09 MED ORDER — ZOLPIDEM TARTRATE 5 MG PO TABS: 5.0000 mg | ORAL_TABLET | Freq: Every evening | ORAL | Status: DC | PRN

## 2017-03-09 MED ORDER — LACTATED RINGERS IV SOLN
INTRAVENOUS | Status: DC
  Administered 2017-03-09: 18:00:00 via INTRAVENOUS

## 2017-03-09 MED ORDER — OXYTOCIN 10 UNIT/ML IJ SOLN
INTRAMUSCULAR | Status: AC
  Filled 2017-03-09: qty 3

## 2017-03-09 MED ORDER — ACETAMINOPHEN 325 MG PO TABS: 325.0000 mg | ORAL_TABLET | ORAL | Status: DC | PRN

## 2017-03-09 MED ORDER — EPHEDRINE 5 MG/ML INJ: 10.0000 mg | INTRAVENOUS | Status: DC | PRN

## 2017-03-09 MED ORDER — PHENYLEPHRINE 40 MCG/ML (10ML) SYRINGE FOR IV PUSH (FOR BLOOD PRESSURE SUPPORT)
80.0000 ug | PREFILLED_SYRINGE | INTRAVENOUS | Status: DC | PRN
  Filled 2017-03-09: qty 10

## 2017-03-09 MED ORDER — TERBUTALINE SULFATE 1 MG/ML IJ SOLN
INTRAMUSCULAR | Status: AC
  Administered 2017-03-09: 1 mg
  Filled 2017-03-09: qty 1

## 2017-03-09 MED ORDER — TETANUS-DIPHTH-ACELL PERTUSSIS 5-2.5-18.5 LF-MCG/0.5 IM SUSP
0.5000 mL | Freq: Once | INTRAMUSCULAR | Status: DC
  Filled 2017-03-09: qty 0.5

## 2017-03-09 MED ORDER — SIMETHICONE 80 MG PO CHEW
80.0000 mg | CHEWABLE_TABLET | ORAL | Status: DC | PRN
  Filled 2017-03-09: qty 1

## 2017-03-09 MED ORDER — NALOXONE HCL 0.4 MG/ML IJ SOLN
0.4000 mg | INTRAMUSCULAR | Status: DC | PRN
Start: 2017-03-09 — End: 2017-03-11

## 2017-03-09 MED ORDER — FENTANYL CITRATE (PF) 100 MCG/2ML IJ SOLN
25.0000 ug | INTRAMUSCULAR | Status: DC | PRN
  Administered 2017-03-09 (×2): 50 ug via INTRAVENOUS

## 2017-03-09 MED ORDER — PHENYLEPHRINE 40 MCG/ML (10ML) SYRINGE FOR IV PUSH (FOR BLOOD PRESSURE SUPPORT): 80.0000 ug | PREFILLED_SYRINGE | INTRAVENOUS | Status: DC | PRN

## 2017-03-09 MED ORDER — LACTATED RINGERS IV SOLN
INTRAVENOUS | Status: DC | PRN
  Administered 2017-03-09: 04:00:00 via INTRAVENOUS

## 2017-03-09 MED ORDER — DIPHENHYDRAMINE HCL 25 MG PO CAPS
25.0000 mg | ORAL_CAPSULE | Freq: Four times a day (QID) | ORAL | Status: DC | PRN
  Administered 2017-03-09: 25 mg via ORAL
  Filled 2017-03-09 (×2): qty 1

## 2017-03-09 MED ORDER — CEFAZOLIN SODIUM-DEXTROSE 2-4 GM/100ML-% IV SOLN: 2.0000 g | Freq: Once | INTRAVENOUS | Status: DC

## 2017-03-09 MED ORDER — MORPHINE SULFATE (PF) 0.5 MG/ML IJ SOLN
INTRAMUSCULAR | Status: DC | PRN
  Administered 2017-03-09: 4 mg via EPIDURAL
  Administered 2017-03-09: 1 mg via INTRAVENOUS

## 2017-03-09 MED ORDER — KETOROLAC TROMETHAMINE 30 MG/ML IJ SOLN
INTRAMUSCULAR | Status: AC
  Administered 2017-03-09: 30 mg
  Filled 2017-03-09: qty 1

## 2017-03-09 MED ORDER — NALBUPHINE HCL 10 MG/ML IJ SOLN: 5.0000 mg | INTRAMUSCULAR | Status: DC | PRN

## 2017-03-09 MED ORDER — OXYTOCIN 10 UNIT/ML IJ SOLN
INTRAVENOUS | Status: DC | PRN
  Administered 2017-03-09: 40 [IU] via INTRAVENOUS

## 2017-03-09 MED ORDER — OXYCODONE HCL 5 MG PO TABS: 5.0000 mg | ORAL_TABLET | Freq: Once | ORAL | Status: DC | PRN

## 2017-03-09 MED ORDER — MIDAZOLAM HCL 2 MG/2ML IJ SOLN
INTRAMUSCULAR | Status: AC
  Filled 2017-03-09: qty 2

## 2017-03-09 MED ORDER — DIPHENHYDRAMINE HCL 50 MG/ML IJ SOLN: 12.5000 mg | INTRAMUSCULAR | Status: DC | PRN

## 2017-03-09 MED ORDER — KETOROLAC TROMETHAMINE 30 MG/ML IJ SOLN: 30.0000 mg | Freq: Once | INTRAMUSCULAR | Status: DC | PRN

## 2017-03-09 MED ORDER — SIMETHICONE 80 MG PO CHEW
80.0000 mg | CHEWABLE_TABLET | Freq: Three times a day (TID) | ORAL | Status: DC
  Administered 2017-03-09 – 2017-03-11 (×8): 80 mg via ORAL
  Filled 2017-03-09 (×14): qty 1

## 2017-03-09 MED ORDER — SODIUM BICARBONATE 8.4 % IV SOLN
INTRAVENOUS | Status: AC
  Filled 2017-03-09: qty 50

## 2017-03-09 MED ORDER — DIBUCAINE 1 % RE OINT
1.0000 "application " | TOPICAL_OINTMENT | RECTAL | Status: DC | PRN
  Filled 2017-03-09: qty 28

## 2017-03-09 MED ORDER — OXYTOCIN 10 UNIT/ML IJ SOLN
INTRAMUSCULAR | Status: AC
  Filled 2017-03-09: qty 1

## 2017-03-09 MED ORDER — SODIUM BICARBONATE 8.4 % IV SOLN
INTRAVENOUS | Status: DC | PRN
  Administered 2017-03-09 (×2): 5 mL via EPIDURAL

## 2017-03-09 MED ORDER — SODIUM BICARBONATE 8.4 % IV SOLN
INTRAVENOUS | Status: DC | PRN
  Administered 2017-03-09 (×4): 5 mL via EPIDURAL

## 2017-03-09 MED ORDER — OXYCODONE HCL 5 MG/5ML PO SOLN: 5.0000 mg | Freq: Once | ORAL | Status: DC | PRN

## 2017-03-09 MED ORDER — SCOPOLAMINE 1 MG/3DAYS TD PT72
MEDICATED_PATCH | TRANSDERMAL | Status: DC | PRN
  Administered 2017-03-09: 1 via TRANSDERMAL

## 2017-03-09 MED ORDER — SODIUM CHLORIDE 0.9 % IV SOLN
500.0000 mg | Freq: Once | INTRAVENOUS | Status: AC
  Administered 2017-03-09: 500 mg via INTRAVENOUS
  Filled 2017-03-09: qty 500

## 2017-03-09 MED ORDER — PRENATAL MULTIVITAMIN CH
1.0000 | ORAL_TABLET | Freq: Every day | ORAL | Status: DC
  Administered 2017-03-09 – 2017-03-11 (×3): 1 via ORAL
  Filled 2017-03-09 (×5): qty 1

## 2017-03-09 MED ORDER — ONDANSETRON HCL 4 MG/2ML IJ SOLN: 4.0000 mg | Freq: Once | INTRAMUSCULAR | Status: DC | PRN

## 2017-03-09 MED ORDER — SIMETHICONE 80 MG PO CHEW
80.0000 mg | CHEWABLE_TABLET | ORAL | Status: DC
  Administered 2017-03-09 – 2017-03-11 (×2): 80 mg via ORAL
  Filled 2017-03-09 (×4): qty 1

## 2017-03-09 MED ORDER — PHENYLEPHRINE HCL 10 MG/ML IJ SOLN
INTRAMUSCULAR | Status: DC | PRN
  Administered 2017-03-09 (×5): 80 ug via INTRAVENOUS

## 2017-03-09 MED ORDER — WITCH HAZEL-GLYCERIN EX PADS: 1.0000 "application " | MEDICATED_PAD | CUTANEOUS | Status: DC | PRN

## 2017-03-09 MED ORDER — MIDAZOLAM HCL 2 MG/2ML IJ SOLN
INTRAMUSCULAR | Status: DC | PRN
  Administered 2017-03-09 (×2): 1 mg via INTRAVENOUS

## 2017-03-09 MED ORDER — LACTATED RINGERS IV SOLN
500.0000 mL | Freq: Once | INTRAVENOUS | Status: AC
  Administered 2017-03-09: 500 mL via INTRAVENOUS

## 2017-03-09 MED ORDER — OXYTOCIN 40 UNITS IN LACTATED RINGERS INFUSION - SIMPLE MED: 2.5000 [IU]/h | INTRAVENOUS | Status: AC

## 2017-03-09 MED ORDER — NALOXONE HCL 4 MG/10ML IJ SOLN
1.0000 ug/kg/h | INTRAVENOUS | Status: DC | PRN
  Filled 2017-03-09: qty 5

## 2017-03-09 MED ORDER — CHLOROPROCAINE HCL (PF) 3 % IJ SOLN: INTRAMUSCULAR | Status: DC | PRN

## 2017-03-09 MED ORDER — CHLOROPROCAINE HCL (PF) 3 % IJ SOLN
INTRAMUSCULAR | Status: AC
  Filled 2017-03-09: qty 20

## 2017-03-09 SURGICAL SUPPLY — 35 items
BENZOIN TINCTURE PRP APPL 2/3 (GAUZE/BANDAGES/DRESSINGS) ×3 IMPLANT
BLADE TIP J-PLASMA PRECISE LAP (MISCELLANEOUS) ×3 IMPLANT
CHLORAPREP W/TINT 26ML (MISCELLANEOUS) ×3 IMPLANT
CLAMP CORD UMBIL (MISCELLANEOUS) IMPLANT
CLOSURE WOUND 1/2 X4 (GAUZE/BANDAGES/DRESSINGS) ×1
CLOTH BEACON ORANGE TIMEOUT ST (SAFETY) ×3 IMPLANT
DRSG OPSITE POSTOP 4X10 (GAUZE/BANDAGES/DRESSINGS) ×3 IMPLANT
ELECT REM PT RETURN 9FT ADLT (ELECTROSURGICAL) ×3
ELECTRODE REM PT RTRN 9FT ADLT (ELECTROSURGICAL) ×1 IMPLANT
EXTRACTOR VACUUM KIWI (MISCELLANEOUS) IMPLANT
GLOVE BIO SURGEON STRL SZ7 (GLOVE) ×3 IMPLANT
GLOVE BIOGEL PI IND STRL 7.0 (GLOVE) ×2 IMPLANT
GLOVE BIOGEL PI INDICATOR 7.0 (GLOVE) ×4
GOWN STRL REUS W/TWL LRG LVL3 (GOWN DISPOSABLE) ×6 IMPLANT
GOWN STRL REUS W/TWL XL LVL3 (GOWN DISPOSABLE) ×3 IMPLANT
KIT ABG SYR 3ML LUER SLIP (SYRINGE) ×3 IMPLANT
NEEDLE HYPO 22GX1.5 SAFETY (NEEDLE) ×3 IMPLANT
NEEDLE HYPO 25X5/8 SAFETYGLIDE (NEEDLE) ×3 IMPLANT
NS IRRIG 1000ML POUR BTL (IV SOLUTION) ×3 IMPLANT
PACK C SECTION WH (CUSTOM PROCEDURE TRAY) ×3 IMPLANT
PAD OB MATERNITY 4.3X12.25 (PERSONAL CARE ITEMS) ×3 IMPLANT
PENCIL SMOKE EVAC W/HOLSTER (ELECTROSURGICAL) ×3 IMPLANT
RTRCTR C-SECT PINK 25CM LRG (MISCELLANEOUS) IMPLANT
SPONGE SURGIFOAM ABS GEL 12-7 (HEMOSTASIS) IMPLANT
STRIP CLOSURE SKIN 1/2X4 (GAUZE/BANDAGES/DRESSINGS) ×2 IMPLANT
SUT PDS AB 0 CTX 60 (SUTURE) IMPLANT
SUT PLAIN 0 NONE (SUTURE) IMPLANT
SUT SILK 0 TIES 10X30 (SUTURE) IMPLANT
SUT VIC AB 0 CT1 36 (SUTURE) ×9 IMPLANT
SUT VIC AB 3-0 CT1 27 (SUTURE) ×2
SUT VIC AB 3-0 CT1 TAPERPNT 27 (SUTURE) ×1 IMPLANT
SUT VIC AB 4-0 KS 27 (SUTURE) IMPLANT
SYR CONTROL 10ML LL (SYRINGE) ×3 IMPLANT
TOWEL OR 17X24 6PK STRL BLUE (TOWEL DISPOSABLE) ×3 IMPLANT
TRAY FOLEY BAG SILVER LF 14FR (SET/KITS/TRAYS/PACK) ×3 IMPLANT

## 2017-03-09 NOTE — Progress Notes (Signed)
Patient complains of itching and does not want the medications offered. Said she will take medication later if it gets worse.

## 2017-03-09 NOTE — Transfer of Care (Signed)
Immediate Anesthesia Transfer of Care Note  Patient: Jordan Lucas  Procedure(s) Performed: CESAREAN SECTION (N/A )  Patient Location: PACU  Anesthesia Type:Epidural  Level of Consciousness: awake and oriented  Airway & Oxygen Therapy: Patient Spontanous Breathing  Post-op Assessment: Post -op Vital signs reviewed and stable  Post vital signs: Reviewed and stable  Last Vitals:  Vitals:   03/09/17 0214 03/09/17 0232  BP: 130/88 103/73  Pulse: 83 70  Resp: 18 18  Temp:  36.9 C  SpO2: 100%     Last Pain:  Vitals:   03/09/17 0232  TempSrc: Oral  PainSc: 0-No pain         Complications: No apparent anesthesia complications

## 2017-03-09 NOTE — Anesthesia Postprocedure Evaluation (Signed)
Anesthesia Post Note  Patient: Jordan Lucas  Procedure(s) Performed: CESAREAN SECTION (N/A )     Patient location during evaluation: Mother Baby Anesthesia Type: Epidural Level of consciousness: awake and alert and oriented Pain management: satisfactory to patient Vital Signs Assessment: post-procedure vital signs reviewed and stable Respiratory status: respiratory function stable Cardiovascular status: stable Postop Assessment: no headache, no backache, epidural receding, patient able to bend at knees, no signs of nausea or vomiting and adequate PO intake Anesthetic complications: no    Last Vitals:  Vitals:   03/09/17 0925 03/09/17 1235  BP: 126/83   Pulse: 84   Resp: 18   Temp: 36.7 C 36.5 C  SpO2: 97% 100%    Last Pain:  Vitals:   03/09/17 1235  TempSrc: Oral  PainSc: 0-No pain   Pain Goal:                 Jordan Lucas

## 2017-03-09 NOTE — Progress Notes (Signed)
Honeycomb dressing starting to come up and is saturated. Pressure dressing not fully on honeycomb. MD notified and instructed to apply another pressure dressing on top of old dressings. Will continue to monitor

## 2017-03-09 NOTE — Progress Notes (Signed)
Patient ID: Jordan CrumbSeason Lucas, female   DOB: 11/18/1980, 37 y.o.   MRN: [id number] Called to see pt for Cat II FHR. Pt admitted for prolonged decelerations that recovered completely. Pt now with persistent Cat II followed by CatIII FHR. Recently given terbutaline with some improvement. Pt is 5-6cm with -1 station and the head is not well  Applied.  Dicussed my concerns with the pt and rec operative delivery.   The risks of cesarean section discussed with the patient included but were not limited to: bleeding which may require transfusion or reoperation; infection which may require antibiotics; injury to bowel, bladder, ureters or other surrounding organs; injury to the fetus; need for additional procedures including hysterectomy in the event of a life-threatening hemorrhage; placental abnormalities wth subsequent pregnancies, incisional problems, thromboembolic phenomenon and other postoperative/anesthesia complications. The patient concurred with the proposed plan, giving informed written consent for the procedure.  Anesthesia and OR aware. Preoperative prophylactic antibiotics and SCDs ordered on call to the OR.  To OR when ready.  Arlett Goold L. Harraway-Smith, M.D., Evern CoreFACOG

## 2017-03-09 NOTE — Addendum Note (Signed)
Addendum  created 03/09/17 1453 by Graciela HusbandsFussell, Eiliyah Reh O, CRNA   Sign clinical note

## 2017-03-09 NOTE — Lactation Note (Signed)
This note was copied from a baby's chart. Lactation Consultation Note  Patient Name: Jordan Lucas Today's Date: 03/09/2017 Reason for consult: Initial assessment;Breast augmentation;Early term 37-38.6wks;Infant < 6lbs   Initial consult with mom of 7 hour old infant. Infant with 1 bottle feeding of formula of 10 cc since birth.   Mom reports she did not BF her older child who is 2516. She would like to BF this infant but is concerned that she has implants. Mom reports her incisions were on the inner aspects of both breasts halfway around areola. Reviewed with mom that the outer aspects of her breasts may function normally and the inner aspects may or may not, she was given information on the bfar website to review.   Infant was STS with mom when LC entered room. Assisted mom with latching infant to the right breast in the cross cradle hold. Infant latched easily and suckled off an on for about 10 minutes. Mom did voice pain with the latch that did improve some with feeding. Nipple was rounded when infant came off. Discussed with mom that with breast surgeries there can be increased or decreased nerve sensation.   Reviewed the LPT infant feeding policy with mom due to size of infant. Discussed continuing supplement with each feeding due to infant weight and Maternal history of breast surgery. Mom agreeable. Reviewed supplementation amounts based on day of age.   Discussed pumping with mom, mom agreeable. DEBP set up for use on Initiate setting. Mom was shown how to assemble, dissasemble and clean pump parts.   BF Resources handout and LC Brochure given, mom informed of IP/OP Services, BF Support Groups and LC phone #. [facility] formula [facility]. Discussed with mom that this would excluder her from [facility] pump [facility]. Mom reports she may purchase a pump on her own. Discussed importance of good hospital grade pump for best results. Mom voiced understanding.  [facility] [place] [facility] should she decide on that.   Plan made with mom :  Breast feed infant 8-12 x a day with feeding cues with no longer than 3 hours between feedings Supplement infant with pumped BM or formula per supplementation guidelines Pump for 15 minutes with DEBP on Initiate setting Hand express post pumping Call out for assistance as needed     Maternal Data Formula Feeding for Exclusion: Yes Reason for exclusion: Mother's choice to formula and breast feed on admission Has patient been taught Hand Expression?: Yes Does the patient have breastfeeding experience prior to this delivery?: No  Feeding Feeding Type: Breast Fed Length of feed: 10 min  LATCH Score Latch: Grasps breast easily, tongue down, lips flanged, rhythmical sucking.  Audible Swallowing: A few with stimulation  Type of Nipple: Everted at rest and after stimulation  Comfort (Breast/Nipple): Filling, red/small blisters or bruises, mild/mod discomfort  Hold (Positioning): Assistance needed to correctly position infant at breast and maintain latch.  LATCH Score: 7  Interventions Interventions: Breast feeding basics reviewed;Support pillows;Assisted with latch;Position options;Skin to skin;Expressed milk;Breast massage;Breast compression;Hand express;DEBP  Lactation Tools Discussed/Used WIC Program: Yes Pump Review: Setup, frequency, and cleaning;Milk Storage Initiated by:: by Rock NephewLaura Moulden. LC Date initiated:: 03/09/17   Consult Status Consult Status: Follow-up Date: 03/10/17 Follow-up type: In-patient    Silas FloodSharon S Alexsa Flaum 03/09/2017, 12:27 PM

## 2017-03-09 NOTE — Anesthesia Preprocedure Evaluation (Signed)

## 2017-03-09 NOTE — Anesthesia Procedure Notes (Signed)
Epidural Patient location during procedure: OB Start time: 03/09/2017 1:45 AM End time: 03/09/2017 1:55 AM  Staffing Anesthesiologist: Bethena Midgetddono, Ilana Prezioso, MD  Preanesthetic Checklist Completed: patient identified, site marked, surgical consent, pre-op evaluation, timeout performed, IV checked, risks and benefits discussed and monitors and equipment checked  Epidural Patient position: sitting Prep: site prepped and draped and DuraPrep Patient monitoring: continuous pulse ox and blood pressure Approach: midline Location: L3-L4 Injection technique: LOR air  Needle:  Needle type: Tuohy  Needle gauge: 17 G Needle length: 9 cm and 9 Needle insertion depth: 6 cm Catheter type: closed end flexible Catheter size: 19 Gauge Catheter at skin depth: 11 cm Test dose: negative  Assessment Events: blood not aspirated, injection not painful, no injection resistance, negative IV test and no paresthesia

## 2017-03-09 NOTE — Progress Notes (Signed)
   Patient Vitals for the past 6 hrs:  BP Temp Temp src Pulse Resp SpO2  03/09/17 0131 - - - - - 99 %  03/09/17 0126 - - - - - 100 %  03/09/17 0040 - 98.2 F (36.8 C) Oral - - -  03/09/17 0036 - - - - (!) 22 -  03/09/17 0028 - - - - - 100 %  03/08/17 2323 106/67 - - 75 - -  03/08/17 2235 - - - - 18 -  03/08/17 2230 123/80 - - 72 - -  03/08/17 2140 - 98.7 F (37.1 C) Axillary - 18 -  03/08/17 2136 133/84 - - 76 - -  03/08/17 2047 122/80 - - 78 - -   Shortly before and while sitting for epidural,  FHR having repetitive variable decels w/slow return )~ 2 minutres) to baseline. Variability moderate, + acceleration.  Ctx 1 1-2 minutes, not on pitocin.  IUPC placed and amnioinfusion started.  Multiple position changes, 02 on.  Terbutaline 0.25mg  given sq. [id number]. Cx has gone from 5/80 to 6.5/90 over the last hour, so hopeful for quick resolution of decels.  Dr. Erin FullingHarraway-Smith given report.   Still having decels, remote from delivery. Going for CS

## 2017-03-09 NOTE — Op Note (Signed)
Jordan Lucas PROCEDURE DATE: 03/09/2017  PREOPERATIVE DIAGNOSES: Intrauterine pregnancy at [redacted]w[redacted]d weeks gestation; non-reassuring fetal status  POSTOPERATIVE DIAGNOSES: The same  PROCEDURE: Primary Low Transverse Cesarean Section  SURGEON, primary: Willodean Rosenthal, MD SURGEON, fellow: Rolm Bookbinder, DO  ANESTHESIOLOGY TEAM: Anesthesiologist: Bethena Midget, MD  INDICATIONS: Jordan Lucas is a 37 y.o. [phone] at [redacted]w[redacted]d here for cesarean section secondary to the indications listed under preoperative diagnoses; please see preoperative note for further details.  The risks of cesarean section were discussed with the patient including but were not limited to: bleeding which may require transfusion or reoperation; infection which may require antibiotics; injury to bowel, bladder, ureters or other surrounding organs; injury to the fetus; need for additional procedures including hysterectomy in the event of a life-threatening hemorrhage; placental abnormalities wth subsequent pregnancies, incisional problems, thromboembolic phenomenon and other postoperative/anesthesia complications.   The patient concurred with the proposed plan, giving informed written consent for the procedure.    FINDINGS:  Viable female infant in cephalic presentation.  Apgars 8 and 9.  Clear amniotic fluid.  There was a nuchal cord that was reduced prior to delivery. Intact placenta, three vessel cord.  Normal uterus, fallopian tubes and ovaries bilaterally. Cord pH pending.   ANESTHESIA: Epidural  ESTIMATED BLOOD LOSS: 933 ml URINE OUTPUT:  100 ml SPECIMENS: Placenta sent to L&D COMPLICATIONS: None immediate  PROCEDURE IN DETAIL:  The patient preoperatively received intravenous antibiotics and had sequential compression devices applied to her lower extremities.  She was then taken to the operating room where the epidural anesthesia was dosed up to surgical level and was found to be adequate. She was then placed in a dorsal  supine position with a leftward tilt, and prepped and draped in a sterile manner.  A foley catheter was placed into her bladder and attached to constant gravity.  After an adequate timeout was performed, a Pfannenstiel skin incision was made with scalpel and carried through to the underlying layer of fascia. The fascia was incised in the midline, and this incision was extended bilaterally using the Mayo scissors.  Kocher clamps were applied to the superior aspect of the fascial incision and the underlying rectus muscles were dissected off bluntly.  A similar process was carried out on the inferior aspect of the fascial incision. The rectus muscles were separated in the midline bluntly and the peritoneum was entered bluntly. A bladder flap was created with Metzenbaum scissors. Attention was turned to the lower uterine segment where a low transverse hysterotomy was made with a scalpel and extended bilaterally bluntly.  The infant was successfully delivered, the cord was clamped and cut after one minute, and the infant was handed over to the awaiting neonatology team. Uterine massage was then administered, and the placenta delivered intact with a three-vessel cord. The uterus was then cleared of clots and debris.  The hysterotomy was closed with 0 Vicryl in a running locked fashion, and an imbricating layer was also placed with 0 Vicryl. The pelvis was cleared of all clot and debris. Hemostasis was confirmed on all surfaces.  The peritoneum and the rectus muscles were reapproximated using 0 Vicryl with a single stitch. The fascia was then closed using 0 Vicryl in a running fashion.  The subcutaneous layer was irrigated, and 30 ml of 0.5% Marcaine was injected subcutaneously around the incision.  The skin was closed with a 4-0 Vicryl subcuticular stitch. The patient tolerated the procedure well. Sponge, lap, instrument and needle counts were correct x 3.  She was  taken to the recovery room in stable condition.     Rolm BookbinderAmber Stan Cantave, DO Faculty Practice, [facility]

## 2017-03-09 NOTE — Addendum Note (Signed)
Addendum  created 03/09/17 1148 by Mal AmabileFoster, Delainy Mcelhiney, MD   Order list changed, Order sets accessed

## 2017-03-09 NOTE — Anesthesia Postprocedure Evaluation (Signed)
Anesthesia Post Note  Patient: Jordan Lucas  Procedure(s) Performed: CESAREAN SECTION (N/A )     Patient location during evaluation: Mother Baby Anesthesia Type: Epidural Level of consciousness: awake and alert Pain management: pain level controlled Vital Signs Assessment: post-procedure vital signs reviewed and stable Respiratory status: spontaneous breathing, nonlabored ventilation and respiratory function stable Cardiovascular status: stable Postop Assessment: no headache, no backache and epidural receding Anesthetic complications: no    Last Vitals:  Vitals:   03/09/17 0545 03/09/17 0607  BP: 125/89 133/83  Pulse: (!) 102 75  Resp: 15 16  Temp:  36.4 C  SpO2:  98%    Last Pain:  Vitals:   03/09/17 0607  TempSrc: Oral  PainSc: 0-No pain   Pain Goal:                 Michel Eskelson

## 2017-03-10 ENCOUNTER — Other Ambulatory Visit: Payer: Self-pay

## 2017-03-10 LAB — CBC
HEMATOCRIT: 27.1 % — AB (ref 36.0–46.0)
Hemoglobin: 9.1 g/dL — ABNORMAL LOW (ref 12.0–15.0)
MCH: 32 pg (ref 26.0–34.0)
MCHC: 33.6 g/dL (ref 30.0–36.0)
MCV: 95.4 fL (ref 78.0–100.0)
PLATELETS: 147 10*3/uL — AB (ref 150–400)
RBC: 2.84 MIL/uL — ABNORMAL LOW (ref 3.87–5.11)
RDW: 13.9 % (ref 11.5–15.5)
WBC: 12.3 10*3/uL — AB (ref 4.0–10.5)

## 2017-03-10 MED ORDER — OXYCODONE-ACETAMINOPHEN 5-325 MG PO TABS
1.0000 | ORAL_TABLET | ORAL | 0 refills | Status: DC | PRN
Start: 2017-03-10 — End: 2021-08-03

## 2017-03-10 MED ORDER — OXYCODONE HCL 5 MG PO TABS
5.0000 mg | ORAL_TABLET | ORAL | Status: DC | PRN
  Administered 2017-03-10 – 2017-03-11 (×5): 5 mg via ORAL
  Filled 2017-03-10 (×5): qty 1

## 2017-03-10 NOTE — Clinical Social Work Note (Signed)
CSW received consult for hx of marijuana use. Referral was screened out due to the following:  ~MOB had no documented substance use  ~MOB had no positive drug screens ~Baby's UDS was not ordered or colleted and cannot be assessed at this time  Baby's cord was collected and will be sent out.  LCSW will follow cord results and make reports and or referrals as necessary.   Please consult LCSW if current concerns arise or by MOB's request.

## 2017-03-10 NOTE — Progress Notes (Signed)
Subjective: Postpartum Day 1: Cesarean Delivery Patient reports tolerating PO and no problems voiding.  She's ambulating excellently, and want's to go home this PM. She has had breast implants. Bottle feeding. Willing to consider breast feeding so lactation consultant will be asked to see pt this a.m.  Objective: Vital signs in last 24 hours: Temp:  [97.7 F (36.5 C)-98.1 F (36.7 C)] 97.8 F (36.6 C) (02/23 0405) Pulse Rate:  [69-84] 69 (02/23 0405) Resp:  [18] 18 (02/23 0405) BP: (103-132)/(67-83) 112/80 (02/23 0405) SpO2:  [97 %-100 %] 100 % (02/23 0405)  Physical Exam:  General: alert, cooperative and no distress Lochia: appropriate Uterine Fundus: firm at u-0 Incision: healing well, dressing will need to be changed as it is partially released. DVT Evaluation: No evidence of DVT seen on physical exam.  Recent Labs    03/08/17 1629 03/09/17 0638  HGB 11.4* 10.5*  HCT 33.6* 29.9*    Assessment/Plan: Status post Cesarean section. Doing well postoperatively.  Discharge home with standard precautions and return to clinic in 4-6 weeks, this afternoon, if baby released (5lb 11 oz).  Jordan BurrowJohn V Lucas Jordan Lucas 03/10/2017, 7:45 AM

## 2017-03-10 NOTE — Lactation Note (Signed)
This note was copied from a baby's chart. Lactation Consultation Note  Patient Name: Jordan Lucas Today's Date: 03/10/2017 Reason for consult: Follow-up assessment;Infant < 6lbs;Breast augmentation Mom very unsure if she would like to continue breastfeeding.  Discussed her feelings and fears.  She decided she would like to try to latch baby.  Assisted with football hold on right breast.  Baby latched easily and well.  Mom felt initial latch on pain which resolved after first 30 seconds.  Baby sleepy and mom taught how to stimulate him for more active feeding.   Instructed on breast massage. Mom pleased with feeding and the bonding she feels.  Encouraged to call out for assist prn.  Maternal Data    Feeding Feeding Type: Breast Fed  LATCH Score Latch: Grasps breast easily, tongue down, lips flanged, rhythmical sucking.  Audible Swallowing: A few with stimulation  Type of Nipple: Everted at rest and after stimulation  Comfort (Breast/Nipple): Soft / non-tender  Hold (Positioning): Assistance needed to correctly position infant at breast and maintain latch.  LATCH Score: 8  Interventions    Lactation Tools Discussed/Used     Consult Status Consult Status: Follow-up Date: 03/11/17 Follow-up type: In-patient    Huston FoleyMOULDEN, Meet [place] S 03/10/2017, 3:15 PM

## 2017-03-10 NOTE — Discharge Summary (Addendum)
OB Discharge Summary    Patient Name: Jordan Lucas DOB: 1980/06/17 MRN: [id number] Date of admission: 03/08/2017  Delivering MD: Jacklyn Shell )  Date of discharge: 03/11/2017  Admitting diagnosis: Induction of Labor for nonreassuring fetal heart tracing  Intrauterine pregnancy: 103w3d    Secondary diagnosis:  Active Problems:   Patient Active Problem List   Diagnosis Date Noted  . S/P C-section 03/09/2017  . Variable fetal heart rate decelerations, antepartum 03/08/2017  . Positive GBS test 03/01/2017  . Chlamydia infection affecting pregnancy 11/14/2016  . Supervision of normal pregnancy, antepartum 09/19/2016  . Antepartum multigravida of advanced maternal age 61/04/2016   Additional problems: N/A     Discharge diagnosis: Term Pregnancy Delivered via C/S                                                                                                Post partum procedures:None  Complications: None  Hospital course:  Induction of Labor With Cesarean Section  37 y.o. [id number] at [redacted]w[redacted]d was admitted to the hospital 03/08/2017 for induction of labor for nonreassuring fetal heart tracing. Patient progressed well initially with foley bulb and subsequent SROM, however started to have persistent prolonged decels not responsive to positional change. Therefore patient went for cesarean section and delivered a Viable infant,03/09/2017  Membrane Rupture Time/Date: 9:32 PM ,03/08/2017   Details of operation can be found in separate operative Note. Postoperatively, patient had saturated dressings requiring additional pressure dressings. Subsequent CBCs remained stable. Patient otherwise had an uncomplicated postpartum course. She is ambulating, tolerating a regular diet, passing flatus, and urinating well.  Patient is discharged home in stable condition on 03/11/17.                                Physical exam  Vitals:   03/10/17 1839 03/11/17 0603  BP: 120/72 131/78  Pulse: 84 71  Resp:  13 20  Temp: 98.1 F (36.7 C) 98.1 F (36.7 C)  SpO2:  100%   General: alert, cooperative and no distress CV: regular rate and rhythm Lungs: CTAB Abdomen: soft, +BS Lochia: appropriate Uterine Fundus: firm Incision: Dressing is clean and dry, partially released. DVT Evaluation: No evidence of DVT seen on physical exam.  Labs: Results for orders placed or performed during the hospital encounter of 03/08/17 (from the past 24 hour(s))  CBC     Status: Abnormal   Collection Time: 03/10/17  7:28 AM  Result Value Ref Range   WBC 12.3 (H) 4.0 - 10.5 K/uL   RBC 2.84 (L) 3.87 - 5.11 MIL/uL   Hemoglobin 9.1 (L) 12.0 - 15.0 g/dL   HCT 14.7 (L) 82.9 - 56.2 %   MCV 95.4 78.0 - 100.0 fL   MCH 32.0 26.0 - 34.0 pg   MCHC 33.6 30.0 - 36.0 g/dL   RDW 13.0 86.5 - 78.4 %   Platelets 147 (L) 150 - 400 K/uL    Discharge instruction: per After Visit Summary and "Baby and Me Booklet".  After visit meds:  No Known Allergies  Allergies as  of 03/11/2017   No Known Allergies     Medication List    TAKE these medications   acetaminophen 500 MG tablet Commonly known as:  TYLENOL Take 1,000 mg by mouth every 6 (six) hours as needed for mild pain or headache.   calcium carbonate 500 MG chewable tablet Commonly known as:  TUMS - dosed in mg elemental calcium Chew 1 tablet by mouth 2 (two) times daily as needed for indigestion or heartburn.   oxyCODONE-acetaminophen 5-325 MG tablet Commonly known as:  PERCOCET/ROXICET Take 1-2 tablets by mouth every 4 (four) hours as needed for moderate pain or severe pain.   PRENATE PIXIE 10-0.6-0.4-200 MG Caps Take 1 tablet by mouth daily.   triamcinolone ointment 0.5 % Commonly known as:  KENALOG Apply 1 application topically 2 (two) times daily.            Discharge Care Instructions  (From admission, onward)        Start     Ordered   03/10/17 0000  Discharge wound care:    Comments:  Remove dressing in one week   03/10/17 0757       Diet: routine diet  Activity: Advance as tolerated. Pelvic rest for 6 weeks.   Outpatient follow up:1 week for skin incision check Future Appointments:  Future Appointments  Date Time Provider Department Center  04/06/2017  8:30 AM Orvilla Cornwallenney, Rachelle A, CNM [facility] None    Follow up Appt: No Follow-up on file.  Postpartum contraception: IUD    Newborn Data: APGAR (1 MIN): 8   APGAR (5 MINS): 9    Baby Feeding: Bottle Disposition:home with mother  Ellwood Denselison Rumball, DO  03/11/2017  I assessed this pt and agree with above assessment

## 2017-03-10 NOTE — Progress Notes (Signed)
Per Dr Emelda FearFerguson pressure dressing was removed and honeycomb dressing replaced.  The honeycomb dressing was saturated.

## 2017-03-11 NOTE — Discharge Instructions (Signed)

## 2017-03-14 ENCOUNTER — Encounter (HOSPITAL_COMMUNITY): Payer: Self-pay | Admitting: *Deleted

## 2017-03-19 ENCOUNTER — Ambulatory Visit: Payer: Medicaid Other

## 2017-03-21 ENCOUNTER — Ambulatory Visit: Payer: Medicaid Other

## 2017-03-21 DIAGNOSIS — Z4889 Encounter for other specified surgical aftercare: Secondary | ICD-10-CM

## 2017-03-21 NOTE — Progress Notes (Signed)
Agree for A/P

## 2017-03-21 NOTE — Progress Notes (Signed)
Patient is in the office for incision check, pt denies any discharge/odor.

## 2017-04-06 ENCOUNTER — Ambulatory Visit: Payer: Medicaid Other | Admitting: Certified Nurse Midwife

## 2017-04-17 ENCOUNTER — Telehealth: Payer: Self-pay | Admitting: *Deleted

## 2017-04-17 NOTE — Telephone Encounter (Signed)
Lft vmail for pt to callback and reschedule missed PP visit.Marland Kitchen..Marland Kitchen

## 2017-05-01 ENCOUNTER — Ambulatory Visit: Payer: Medicaid Other | Admitting: Certified Nurse Midwife

## 2017-05-08 ENCOUNTER — Ambulatory Visit: Payer: Medicaid Other | Admitting: Certified Nurse Midwife

## 2017-05-10 ENCOUNTER — Encounter: Payer: Self-pay | Admitting: Certified Nurse Midwife

## 2017-05-10 ENCOUNTER — Ambulatory Visit (INDEPENDENT_AMBULATORY_CARE_PROVIDER_SITE_OTHER): Payer: Medicaid Other | Admitting: Certified Nurse Midwife

## 2017-05-10 DIAGNOSIS — Z1389 Encounter for screening for other disorder: Secondary | ICD-10-CM | POA: Diagnosis not present

## 2017-05-10 NOTE — Progress Notes (Signed)
Subjective:     Jordan Lucas is a 37 y.o. female who presents for a postpartum visit. She is 8 weeks postpartum following a low cervical transverse Cesarean section. I have fully reviewed the prenatal and intrapartum course. The delivery was at 1189w2d gestational weeks. Outcome: primary cesarean section, low transverse incision. Anesthesia: spinal. Postpartum course has been normal. Baby's course has been normal. Baby is feeding by bottle - [employer] Good Start. Bleeding no bleeding. Bowel function is normal. Bladder function is normal. Patient is not sexually active. Contraception method is abstinence. Postpartum depression screening: negative.  Tobacco, alcohol and substance abuse history reviewed.  Adult immunizations reviewed including TDAP, rubella and varicella. Elevated blood pressure today: denies HA, upper gastric pain, vision changes.  Smoked prior to appointment.   The following portions of the patient's history were reviewed and updated as appropriate: allergies, current medications, past family history, past medical history, past social history, past surgical history and problem list.  Review of Systems Pertinent items noted in HPI and remainder of comprehensive ROS otherwise negative.   Objective:    BP (!) 151/102 (BP Location: Left Arm, Cuff Size: Large)   Pulse (!) 105   Temp 98.7 F (37.1 C) (Oral)   Resp 18   Wt 152 lb 3.2 oz (69 kg)   LMP 04/29/2017 (Approximate)   Breastfeeding? No   BMI 23.84 kg/m   General:  alert, cooperative and no distress   Breasts:  inspection negative, no nipple discharge or bleeding, no masses or nodularity palpable  Lungs: clear to auscultation bilaterally  Heart:  regular rate and rhythm, S1, S2 normal, no murmur, click, rub or gallop  Abdomen: soft, non-tender; bowel sounds normal; no masses,  no organomegaly.  C/Section wound: well approximated, no s/s infection, non-tender, healed  Pelvic/Rectal Exam: Not performed.          50% of 20  min visit spent on counseling and coordination of care.   Assessment:     Normal 8 week postpartum exam. Pap smear not done at today's visit.     Elevated blood pressure after smoking today  Plan:    1. Contraception: abstinence 2. F/U 2 weeks for OCPs if blood pressure is normal range.  3. Follow up in: 2 weeks or as needed.  2hr GTT for h/o GDM/screening for DM q 3 yrs per ADA recommendations Preconception counseling provided Healthy lifestyle practices reviewed

## 2017-05-24 ENCOUNTER — Ambulatory Visit: Payer: Medicaid Other | Admitting: Certified Nurse Midwife

## 2018-03-31 DIAGNOSIS — H1033 Unspecified acute conjunctivitis, bilateral: Secondary | ICD-10-CM | POA: Diagnosis not present

## 2018-04-30 IMAGING — US US MFM OB FOLLOW-UP
1 series · 14 of 28 positions shown · non-contrast
Comparison: none

[Series 1: us mfm ob follow-up · 33 acquisitions, 14 frames shown]
[im 2/33]
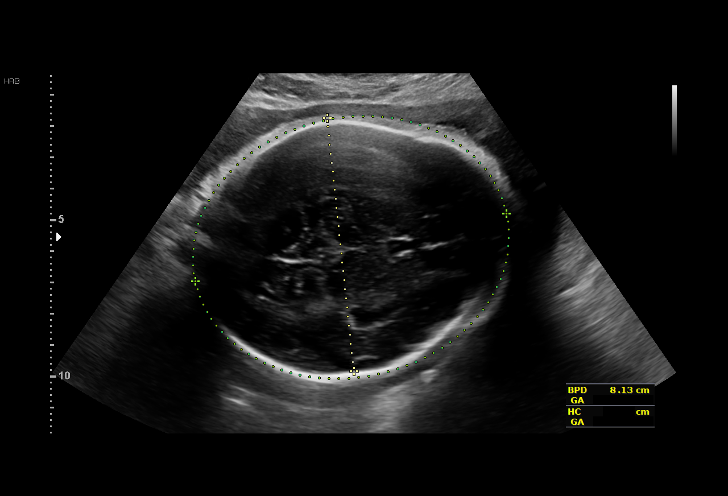
[im 4/33]
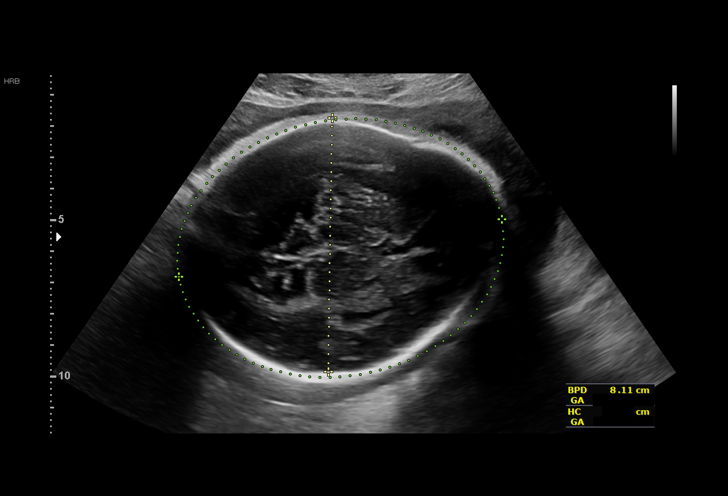
[im 6/33]
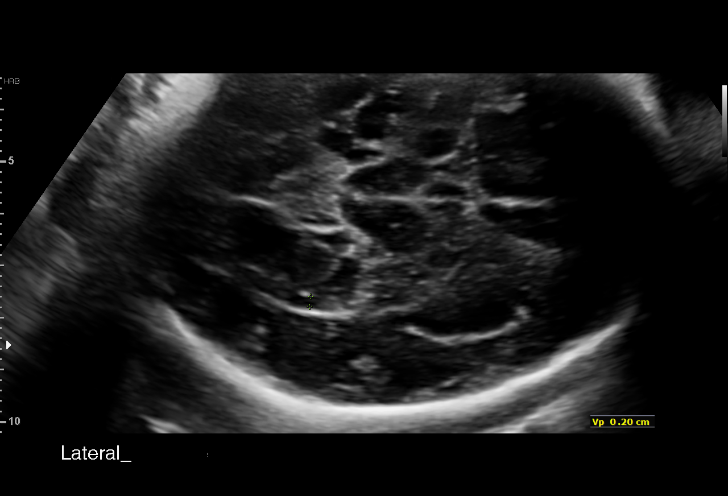
[im 9/33]
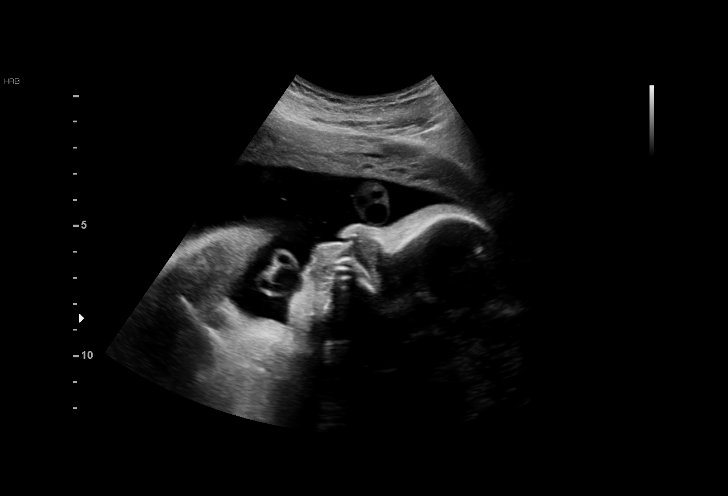
[im 11/33]
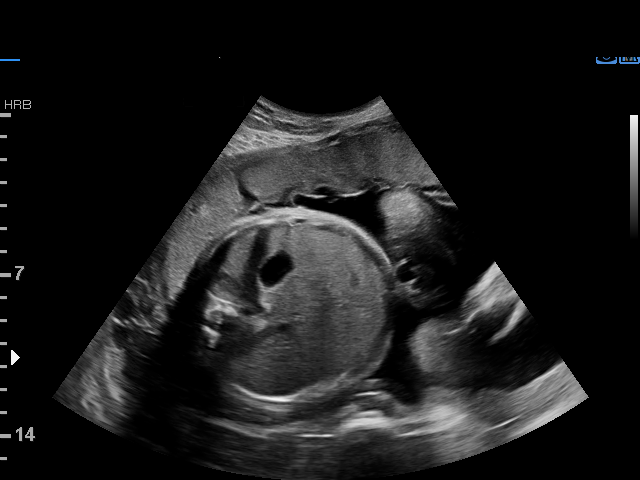
[im 14/33]
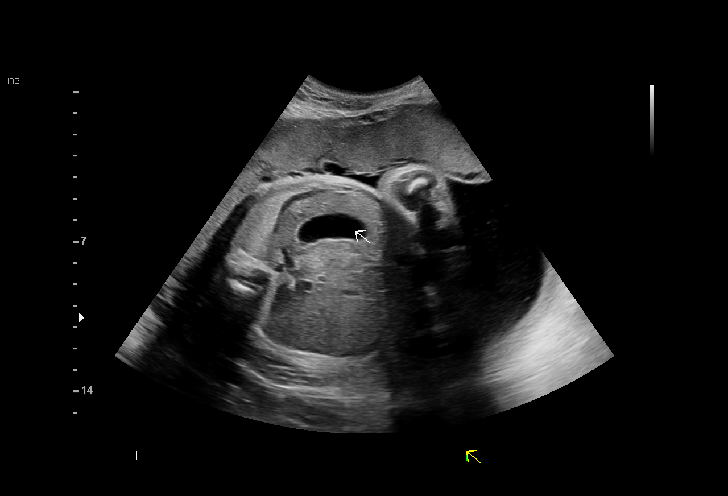
[im 16/33]
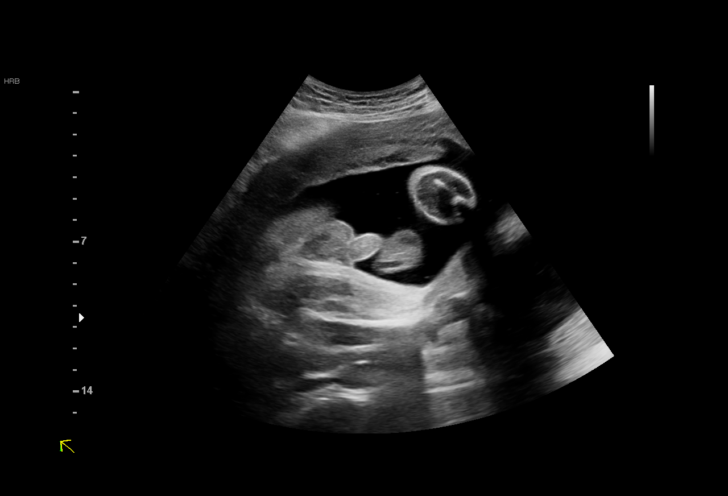
[im 18/33]
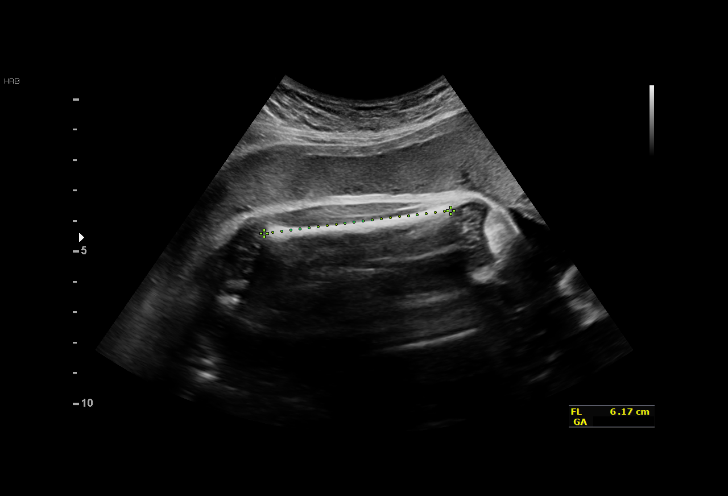
[im 21/33]
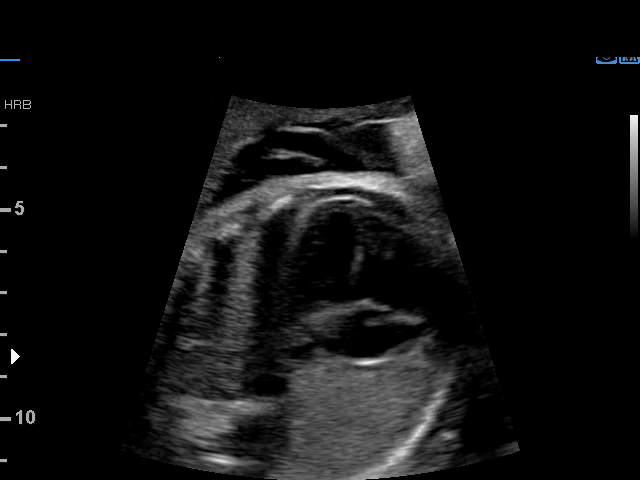
[im 23/33]
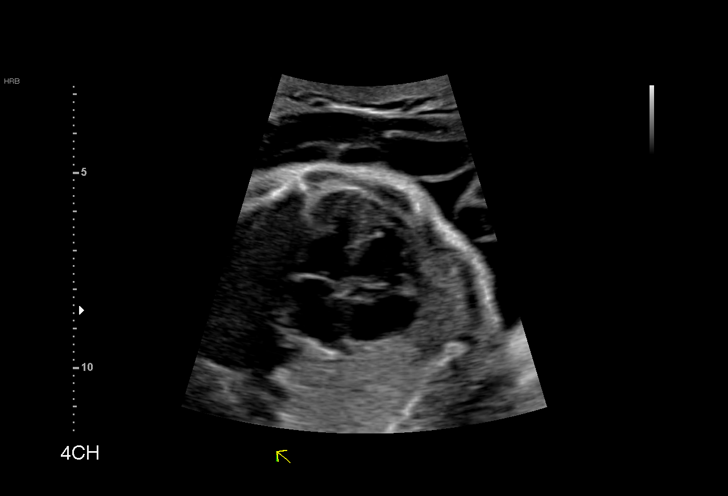
[im 25/33]
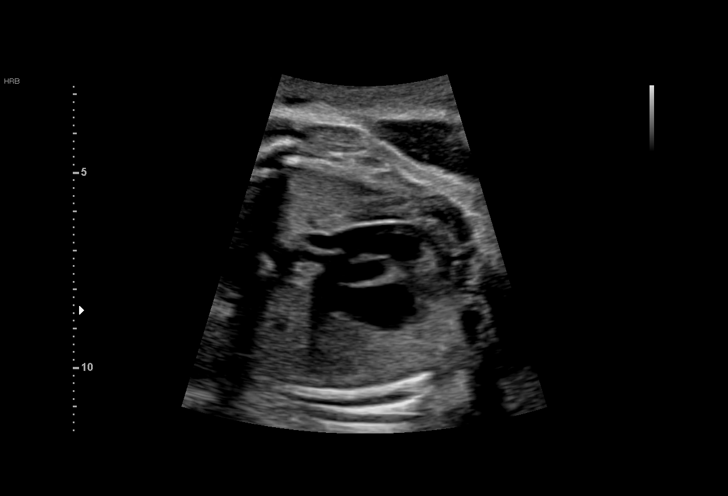
[im 28/33]
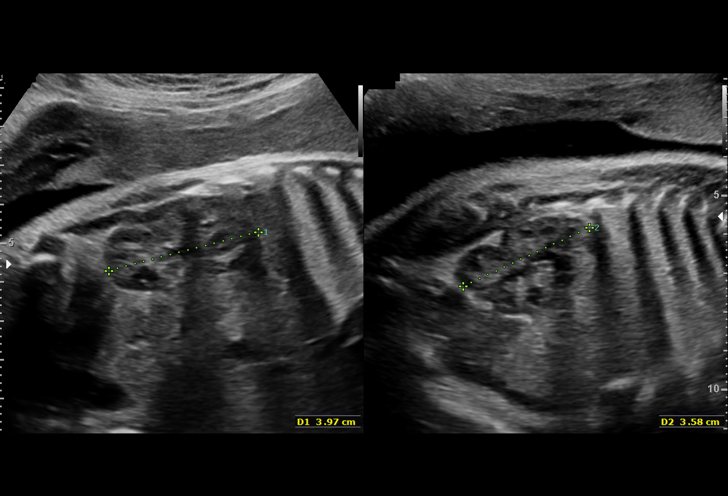
[im 30/33]
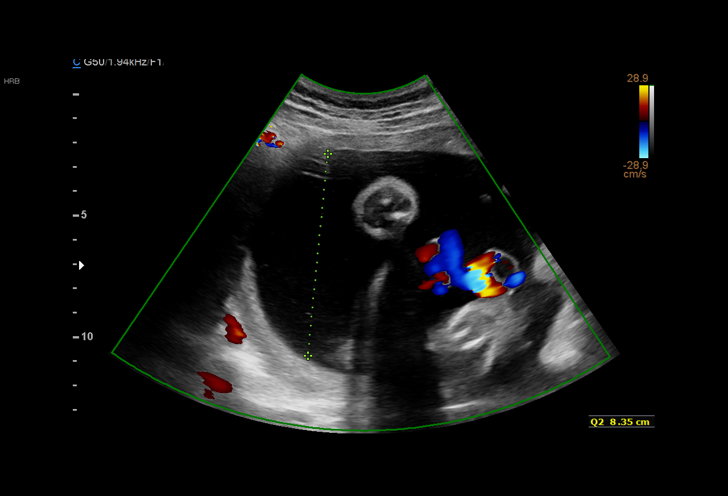
[im 33/33]
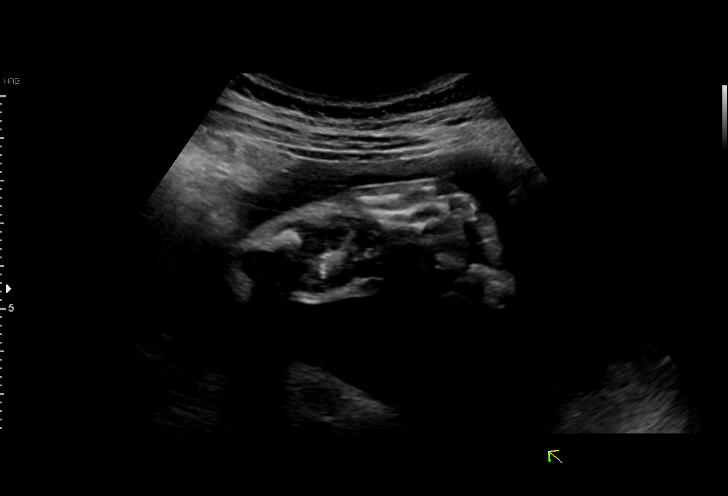

[14 of 28 positions shown; findings below may reference images not displayed]

1  PAULUS N CEEJAY              [id number]      [id number]     [id number]
Indications

33 weeks gestation of pregnancy
Advanced maternal age multigravida 35+,
second trimester; low risk NIPS
Smoking complicating pregnancy, second
trimester
Encounter for other antenatal screening
follow-up
OB History

Blood Type:            Height:  5'7"   Weight (lb):  154       BMI:
Gravidity:    4         Term:   1
TOP:          2        Living:  1
Fetal Evaluation

Num Of Fetuses:     1
Fetal Heart         140
Rate(bpm):
Cardiac Activity:   Observed
Presentation:       Cephalic
Placenta:           Anterior, above cervical os
P. Cord Insertion:  Previously Visualized

Amniotic Fluid
AFI FV:      Subjectively within normal limits

AFI Sum(cm)     %Tile       Largest Pocket(cm)
19.96           75

RUQ(cm)       RLQ(cm)       LUQ(cm)        LLQ(cm)
4.52
Biometry

BPD:      81.2  mm     G. Age:  32w 4d         32  %    CI:        76.56   %    70 - 86
FL/HC:      21.1   %    19.9 -
HC:       294   mm     G. Age:  32w 4d          8  %    HC/AC:      0.97        0.96 -
AC:      302.1  mm     G. Age:  34w 1d         81  %    FL/BPD:     76.5   %    71 - 87
FL:       62.1  mm     G. Age:  32w 1d         19  %    FL/AC:      20.6   %    20 - 24
HUM:      54.6  mm     G. Age:  31w 5d         34  %

Est. FW:    6848  gm    4 lb 12 oz      64  %
Gestational Age

LMP:           35w 5d        Date:  05/25/16                 EDD:   03/01/17
U/S Today:     32w 6d                                        EDD:   03/21/17
Best:          33w 0d     Det. By:  U/S  (10/06/16)          EDD:   03/20/17
Anatomy

Cranium:               Appears normal         Aortic Arch:            Previously seen
Cavum:                 Appears normal         Ductal Arch:            Previously seen
Ventricles:            Appears normal         Diaphragm:              Appears normal
Choroid Plexus:        Previously seen        Stomach:                Appears normal, left
sided
Cerebellum:            Previously seen        Abdomen:                Appears normal
Posterior Fossa:       Previously seen        Abdominal Wall:         Previously seen
Nuchal Fold:           Previously seen        Cord Vessels:           Previously seen
Face:                  Orbits and profile     Kidneys:                Appear normal
previously seen
Lips:                  Previously seen        Bladder:                Appears normal
Thoracic:              Appears normal         Spine:                  Previously seen
Heart:                 Appears normal         Upper Extremities:      Previously seen
(4CH, axis, and
situs)
RVOT:                  Appears normal         Lower Extremities:      Previously seen
LVOT:                  Appears normal

Other:  Fetus appears to be a male. Heels previously visualized.
Cervix Uterus Adnexa

Cervix
Not visualized (advanced GA >84wks)
Impression

Singleton intrauterine pregnancy at 33+0 weeks with AMA
here for growth evaluation
Interval review of the anatomy shows no sonographic
markers for aneuploidy or structural anomalies
All relevant fetal anatomy has been visualized
Amniotic fluid volume is normal
Estimated fetal weight shows growth in the 64th percentile
Recommendations

Follow-up ultrasounds as clinically indicated.

## 2018-06-04 ENCOUNTER — Ambulatory Visit: Payer: Medicaid Other | Admitting: Obstetrics

## 2019-02-12 ENCOUNTER — Ambulatory Visit: Payer: Medicaid Other | Attending: Internal Medicine

## 2019-02-12 DIAGNOSIS — Z20822 Contact with and (suspected) exposure to covid-19: Secondary | ICD-10-CM | POA: Diagnosis not present

## 2019-02-13 LAB — NOVEL CORONAVIRUS, NAA: SARS-CoV-2, NAA: NOT DETECTED

## 2019-02-28 ENCOUNTER — Encounter: Payer: Self-pay | Admitting: Women's Health

## 2019-02-28 ENCOUNTER — Other Ambulatory Visit: Payer: Self-pay

## 2019-02-28 ENCOUNTER — Ambulatory Visit (INDEPENDENT_AMBULATORY_CARE_PROVIDER_SITE_OTHER): Payer: Medicaid Other | Admitting: Women's Health

## 2019-02-28 ENCOUNTER — Other Ambulatory Visit (HOSPITAL_COMMUNITY)
Admission: RE | Admit: 2019-02-28 | Discharge: 2019-02-28 | Disposition: A | Discharge status: Home / Self Care | Payer: Medicaid Other | Source: Ambulatory Visit | Attending: Women's Health | Admitting: Women's Health

## 2019-02-28 VITALS — BP 130/67 | HR 82 | Wt 154.0 lb

## 2019-02-28 DIAGNOSIS — Z0000 Encounter for general adult medical examination without abnormal findings: Secondary | ICD-10-CM | POA: Diagnosis not present

## 2019-02-28 DIAGNOSIS — Z124 Encounter for screening for malignant neoplasm of cervix: Secondary | ICD-10-CM | POA: Diagnosis present

## 2019-02-28 DIAGNOSIS — Z113 Encounter for screening for infections with a predominantly sexual mode of transmission: Secondary | ICD-10-CM | POA: Insufficient documentation

## 2019-02-28 DIAGNOSIS — Z01419 Encounter for gynecological examination (general) (routine) without abnormal findings: Secondary | ICD-10-CM

## 2019-02-28 DIAGNOSIS — Z3040 Encounter for surveillance of contraceptives, unspecified: Secondary | ICD-10-CM

## 2019-02-28 LAB — POCT URINE PREGNANCY: Preg Test, Ur: NEGATIVE

## 2019-02-28 NOTE — Progress Notes (Signed)
Patient presents for Annual Exam.  Last pap: 09/19/16 +HPV   LMP: 02/12/2019 STD Screening: Desired Contraception: None wants to discuss [facility] options. Pt did have unprotected intercourse .  CC: pt notes vaginal discomfort describes as if tampon was stuck. Pt states she feels like something is wrong.pt states she feels as if she has vaginal swelling.   Pt denies any pain Notes abnormal bleeding /spotting x 3 days ago.

## 2019-02-28 NOTE — Progress Notes (Signed)
GYNECOLOGY ANNUAL PREVENTATIVE CARE ENCOUNTER NOTE  History:     Jordan Lucas is a 39 y.o. [phone] female here for a routine annual gynecologic exam.  Current complaints: vaginal swelling x several months - pt reports she "feels more cushion" from her anus to her vagina." Denies abnormal vaginal bleeding, discharge, pelvic pain, problems with intercourse or other gynecologic concerns. Pt requests STD testing today. Pt reports she does not perform SBE. Pt reports she has breast implants, but one deflated, so her breasts are asymmetrical. Pt denies bowel or bladder concerns. No family hx of breast, colon, endometrial or ovarian cancer. Pt does not use drugs. Pt smokes cigarettes and marijuana occasionally. Pt drinks wine 1-2 glasses at night 4-5days per week. Pt reports she is in the process of quitting cigarettes and is cutting down on her use and only buys individual cigarettes at a time. Last dental exam: 1.5 years, has appt 03/2019. Last eye exam: several years ago.      Gynecologic History Patient's last menstrual period was 02/12/2019. Menstruation: once per month, 5-6 days, heavy bleeding, severe dysmenorrhea. Contraception: none. Pt reports she does not desire pregnancy in the next year. Last Pap: 09/2016. 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 Pt reports history of abnormal Pap.  Obstetric History OB History  Gravida Para Term Preterm AB Living  5 2 2   3 2   SAB TAB Ectopic Multiple Live Births  0 3   0 2    # Outcome Date GA Lbr Len/2nd Weight Sex Delivery Anes PTL Lv  5 Term 03/09/17 [redacted]w[redacted]d  5 lb 14.2 oz (2.67 kg) M CS-LTranv EPI  LIV  4 TAB 2009 [redacted]w[redacted]d         3 Term 04/01/00 [redacted]w[redacted]d  6 lb 2 oz (2.778 kg) F Vag-Spont   LIV  2 TAB 1999          1 TAB 1998 [redacted]w[redacted]d           Past Medical History:  Diagnosis Date  . Medical history non-contributory     Past Surgical History:  Procedure Laterality Date  . BREAST ENHANCEMENT SURGERY    . CESAREAN SECTION N/A 03/09/2017   Procedure: CESAREAN SECTION;  Surgeon: Lavonia Drafts, MD;  Location: [facility];  Service: Obstetrics;  Laterality: N/A;  . LIPOSUCTION      Current Outpatient Medications on File Prior to Visit  Medication Sig Dispense Refill  . acetaminophen (TYLENOL) 500 MG tablet Take 1,000 mg by mouth every 6 (six) hours as needed for mild pain or headache.    . oxyCODONE-acetaminophen (PERCOCET/ROXICET) 5-325 MG tablet Take 1-2 tablets by mouth every 4 (four) hours as needed for moderate pain or severe pain. (Patient not taking: Reported on 05/10/2017) 20 tablet 0  . Prenat-FeAsp-Meth-FA-DHA w/o A (PRENATE PIXIE) 10-0.6-0.4-200 MG CAPS Take 1 tablet by mouth daily. (Patient not taking: Reported on 02/28/2019) 30 capsule 12  . triamcinolone ointment (KENALOG) 0.5 % Apply 1 application topically 2 (two) times daily. (Patient not taking: Reported on 05/10/2017) 30 g 0   No current facility-administered medications on file prior to visit.    No Known Allergies  Social History:  reports that she has quit smoking. Her smoking use included cigarettes. She quit smokeless tobacco use about 3 years ago. She reports current alcohol use. She reports current drug use. Drug: Marijuana.  Family History  Problem Relation Age of Onset  . Hypertension Mother   . Asthma Brother   . Heart disease Maternal  Grandmother   . Hypertension Maternal Grandmother   . Diabetes Paternal Grandmother     The following portions of the patient's history were reviewed and updated as appropriate: allergies, current medications, past family history, past medical history, past social history, past surgical history and problem list.  Review of Systems Pertinent items noted in HPI and remainder of comprehensive ROS otherwise negative.  Physical Exam:  BP 130/67   Pulse 82   Wt 154 lb (69.9 kg)   LMP 02/12/2019   Breastfeeding No   BMI 24.12 kg/m  CONSTITUTIONAL: Well-developed, well-nourished female in no acute  distress.  HENT:  Normocephalic, atraumatic, External right and left ear normal. EYES: Conjunctivae and EOM are normal. Pupils are equal, round, and reactive to light. No scleral icterus.  NECK: Normal range of motion, supple, no masses.  Normal thyroid.  SKIN: Skin is warm and dry. No rash noted. Not diaphoretic. No erythema. No pallor. MUSCULOSKELETAL: Normal range of motion. No tenderness.  No cyanosis, clubbing, or edema. NEUROLOGIC: Alert and oriented to person, place, and time. Normal reflexes, muscle tone coordination. PSYCHIATRIC: Normal mood and affect. Normal behavior. Normal judgment and thought content. CARDIOVASCULAR: Normal heart rate noted, regular rhythm. RESPIRATORY: Clear to auscultation bilaterally. Effort and breath sounds normal, no problems with respiration noted. BREASTS: Asymmetric in size per deflation of implant in left breast. No masses, skin changes, nipple drainage, or lymphadenopathy. ABDOMEN: Soft, normal bowel sounds, no distention noted.  No tenderness, rebound or guarding.  PELVIC: Normal appearing external genitalia; normal appearing vaginal mucosa and cervix.  No abnormal discharge noted.  Pap smear obtained.  Normal uterine size, no other palpable masses, no uterine or adnexal tenderness.   Assessment and Plan:    1. Screening for cervical cancer - Cytology - PAP( [place])  2. Encounter for annual routine gynecological examination -pt desires Mirena IUD, has had one in the past -pt had unprotected intercourse one week ago -pt will call after next menses to schedule insertion -pt to avoid intercourse or only have intercourse with condoms from now until insertion  3. Screening for STD (sexually transmitted disease) - Cervicovaginal ancillary only( [place]) - Hepatitis B surface antigen - Hepatitis C antibody - HIV Antibody (routine testing w rflx) - RPR  Will follow up results of pap smear and other testing, if performed, and manage  accordingly. Routine preventative health maintenance measures emphasized. Self-breast awareness taught, importance discussed, advised when to RTC, SBA literature given. Please refer to After Visit Summary for other counseling recommendations.      Gearldine Shown, [facility] Nurse Practitioner, [facility] for [employer], [facility]

## 2019-02-28 NOTE — Patient Instructions (Addendum)
Breast Self-Awareness Breast self-awareness means being familiar with how your breasts look and feel. It involves checking your breasts regularly and reporting any changes to your health care provider. Practicing breast self-awareness is important. Sometimes changes may not be harmful (are benign), but sometimes a change in your breasts can be a sign of a serious medical problem. It is important to learn how to do this procedure correctly so that you can catch problems early, when treatment is more likely to be successful. All women should practice breast self-awareness, including women who have had breast implants. What you need:  A mirror.  A well-lit room. How to do a breast self-exam A breast self-exam is one way to learn what is normal for your breasts and whether your breasts are changing. To do a breast self-exam: Look for changes  1. Remove all the clothing above your waist. 2. Stand in front of a mirror in a room with good lighting. 3. Put your hands on your hips. 4. Push your hands firmly downward. 5. Compare your breasts in the mirror. Look for differences between them (asymmetry), such as: ? Differences in shape. ? Differences in size. ? Puckers, dips, and bumps in one breast and not the other. 6. Look at each breast for changes in the skin, such as: ? Redness. ? Scaly areas. 7. Look for changes in your nipples, such as: ? Discharge. ? Bleeding. ? Dimpling. ? Redness. ? A change in position. Feel for changes Carefully feel your breasts for lumps and changes. It is best to do this while lying on your back on the floor, and again while sitting or standing in the tub or shower with soapy water on your skin. Feel each breast in the following way: 1. Place the arm on the side of the breast you are examining above your head. 2. Feel your breast with the other hand. 3. Start in the nipple area and make -inch (2 cm) overlapping circles to feel your breast. Use the pads of your  three middle fingers to do this. Apply light pressure, then medium pressure, then firm pressure. The light pressure will allow you to feel the tissue closest to the skin. The medium pressure will allow you to feel the tissue that is a little deeper. The firm pressure will allow you to feel the tissue close to the ribs. 4. Continue the overlapping circles, moving downward over the breast until you feel your ribs below your breast. 5. Move one finger-width toward the center of the body. Continue to use the -inch (2 cm) overlapping circles to feel your breast as you move slowly up toward your collarbone. 6. Continue the up-and-down exam using all three pressures until you reach your armpit.  Write down what you find Writing down what you find can help you remember what to discuss with your health care provider. Write down:  What is normal for each breast.  Any changes that you find in each breast, including: ? The kind of changes you find. ? Any pain or tenderness. ? Size and location of any lumps.  Where you are in your menstrual cycle, if you are still menstruating. General tips and recommendations  Examine your breasts every month.  If you are breastfeeding, the best time to examine your breasts is after a feeding or after using a breast pump.  If you menstruate, the best time to examine your breasts is 5-7 days after your period. Breasts are generally lumpier during menstrual periods, and it may  be more difficult to notice changes.  With time and practice, you will become more familiar with the variations in your breasts and more comfortable with the exam. Contact a health care provider if you:  See a change in the shape or size of your breasts or nipples.  See a change in the skin of your breast or nipples, such as a reddened or scaly area.  Have unusual discharge from your nipples.  Find a lump or thick area that was not there before.  Have pain in your breasts.  Have any  concerns related to your breast health. Summary  Breast self-awareness includes looking for physical changes in your breasts, as well as feeling for any changes within your breasts.  Breast self-awareness should be performed in front of a mirror in a well-lit room.  You should examine your breasts every month. If you menstruate, the best time to examine your breasts is 5-7 days after your menstrual period.  Let your health care provider know of any changes you notice in your breasts, including changes in size, changes on the skin, pain or tenderness, or unusual fluid from your nipples. This information is not intended to replace advice given to you by your health care provider. Make sure you discuss any questions you have with your health care provider. Document Revised: 08/21/2017 Document Reviewed: 08/21/2017 Elsevier Patient Education  [place] 35-82 Years Old, Female Preventive care refers to visits with your health care provider and lifestyle choices that can promote health and wellness. This includes:  A yearly physical exam. This may also be called an annual well check.  Regular dental visits and eye exams.  Immunizations.  Screening for certain conditions.  Healthy lifestyle choices, such as eating a healthy diet, getting regular exercise, not using drugs or products that contain nicotine and tobacco, and limiting alcohol use. What can I expect for my preventive care visit? Physical exam Your health care provider will check your:  Height and weight. This may be used to calculate body mass index (BMI), which tells if you are at a healthy weight.  Heart rate and blood pressure.  Skin for abnormal spots. Counseling Your health care provider may ask you questions about your:  Alcohol, tobacco, and drug use.  Emotional well-being.  Home and relationship well-being.  Sexual activity.  Eating habits.  Work and work Statistician.  Method of  birth control.  Menstrual cycle.  Pregnancy history. What immunizations do I need?  Influenza (flu) vaccine  This is recommended every year. Tetanus, diphtheria, and pertussis (Tdap) vaccine  You may need a Td booster every 10 years. Varicella (chickenpox) vaccine  You may need this if you have not been vaccinated. Human papillomavirus (HPV) vaccine  If recommended by your health care provider, you may need three doses over 6 months. Measles, mumps, and rubella (MMR) vaccine  You may need at least one dose of MMR. You may also need a second dose. Meningococcal conjugate (MenACWY) vaccine  One dose is recommended if you are age 25-21 years and a first-year college student living in a residence hall, or if you have one of several medical conditions. You may also need additional booster doses. Pneumococcal conjugate (PCV13) vaccine  You may need this if you have certain conditions and were not previously vaccinated. Pneumococcal polysaccharide (PPSV23) vaccine  You may need one or two doses if you smoke cigarettes or if you have certain conditions. Hepatitis A vaccine  You may need this if  you have certain conditions or if you travel or work in places where you may be exposed to hepatitis A. Hepatitis B vaccine  You may need this if you have certain conditions or if you travel or work in places where you may be exposed to hepatitis B. Haemophilus influenzae type b (Hib) vaccine  You may need this if you have certain conditions. You may receive vaccines as individual doses or as more than one vaccine together in one shot (combination vaccines). Talk with your health care provider about the risks and benefits of combination vaccines. What tests do I need?  Blood tests  Lipid and cholesterol levels. These may be checked every 5 years starting at age 61.  Hepatitis C test.  Hepatitis B test. Screening  Diabetes screening. This is done by checking your blood sugar  (glucose) after you have not eaten for a while (fasting).  Sexually transmitted disease (STD) testing.  BRCA-related cancer screening. This may be done if you have a family history of breast, ovarian, tubal, or peritoneal cancers.  Pelvic exam and Pap test. This may be done every 3 years starting at age 1. Starting at age 66, this may be done every 5 years if you have a Pap test in combination with an HPV test. Talk with your health care provider about your test results, treatment options, and if necessary, the need for more tests. Follow these instructions at home: Eating and drinking   Eat a diet that includes fresh fruits and vegetables, whole grains, lean protein, and low-fat dairy.  Take vitamin and mineral supplements as recommended by your health care provider.  Do not drink alcohol if: ? Your health care provider tells you not to drink. ? You are pregnant, may be pregnant, or are planning to become pregnant.  If you drink alcohol: ? Limit how much you have to 0-1 drink a day. ? Be aware of how much alcohol is in your drink. In the [place] one drink equals one 12 oz bottle of beer (355 mL), one 5 oz glass of wine (148 mL), or one 1 oz glass of hard liquor (44 mL). Lifestyle  Take daily care of your teeth and gums.  Stay active. Exercise for at least 30 minutes on 5 or more days each week.  Do not use any products that contain nicotine or tobacco, such as cigarettes, e-cigarettes, and chewing tobacco. If you need help quitting, ask your health care provider.  If you are sexually active, practice safe sex. Use a condom or other form of birth control (contraception) in order to prevent pregnancy and STIs (sexually transmitted infections). If you plan to become pregnant, see your health care provider for a preconception visit. What's next?  Visit your health care provider once a year for a well check visit.  Ask your health care provider how often you should have your eyes and  teeth checked.  Stay up to date on all vaccines. This information is not intended to replace advice given to you by your health care provider. Make sure you discuss any questions you have with your health care provider. Document Revised: 09/13/2017 Document Reviewed: 09/13/2017 Elsevier Patient Education  [place].   Levonorgestrel intrauterine device (IUD) What is this medicine? LEVONORGESTREL IUD (LEE voe nor jes trel) is a contraceptive (birth control) device. The device is placed inside the uterus by a healthcare professional. It is used to prevent pregnancy. This device can also be used to treat heavy bleeding that occurs  during your period. This medicine may be used for other purposes; ask your health care provider or pharmacist if you have questions. COMMON BRAND NAME(S): Minette Headland What should I tell my health care provider before I take this medicine? They need to know if you have any of these conditions:  abnormal Pap smear  cancer of the breast, uterus, or cervix  diabetes  endometritis  genital or pelvic infection now or in the past  have more than one sexual partner or your partner has more than one partner  heart disease  history of an ectopic or tubal pregnancy  immune system problems  IUD in place  liver disease or tumor  problems with blood clots or take blood-thinners  seizures  use intravenous drugs  uterus of unusual shape  vaginal bleeding that has not been explained  an unusual or allergic reaction to levonorgestrel, other hormones, silicone, or polyethylene, medicines, foods, dyes, or preservatives  pregnant or trying to get pregnant  breast-feeding How should I use this medicine? This device is placed inside the uterus by a health care professional. Talk to your pediatrician regarding the use of this medicine in children. Special care may be needed. Overdosage: If you think you have taken too much of this  medicine contact a poison control center or emergency room at once. NOTE: This medicine is only for you. Do not share this medicine with others. What if I miss a dose? This does not apply. Depending on the brand of device you have inserted, the device will need to be replaced every 3 to 6 years if you wish to continue using this type of birth control. What may interact with this medicine? Do not take this medicine with any of the following medications:  amprenavir  bosentan  fosamprenavir This medicine may also interact with the following medications:  aprepitant  armodafinil  barbiturate medicines for inducing sleep or treating seizures  bexarotene  boceprevir  griseofulvin  medicines to treat seizures like carbamazepine, ethotoin, felbamate, oxcarbazepine, phenytoin, topiramate  modafinil  pioglitazone  rifabutin  rifampin  rifapentine  some medicines to treat HIV infection like atazanavir, efavirenz, indinavir, lopinavir, nelfinavir, tipranavir, ritonavir  St. John's wort  warfarin This list may not describe all possible interactions. Give your health care provider a list of all the medicines, herbs, non-prescription drugs, or dietary supplements you use. Also tell them if you smoke, drink alcohol, or use illegal drugs. Some items may interact with your medicine. What should I watch for while using this medicine? Visit your doctor or health care professional for regular check ups. See your doctor if you or your partner has sexual contact with others, becomes HIV positive, or gets a sexual transmitted disease. This product does not protect you against HIV infection (AIDS) or other sexually transmitted diseases. You can check the placement of the IUD yourself by reaching up to the top of your vagina with clean fingers to feel the threads. Do not pull on the threads. It is a good habit to check placement after each menstrual period. Call your doctor right away if you  feel more of the IUD than just the threads or if you cannot feel the threads at all. The IUD may come out by itself. You may become pregnant if the device comes out. If you notice that the IUD has come out use a backup birth control method like condoms and call your health care provider. Using tampons will not change the position of the  IUD and are okay to use during your period. This IUD can be safely scanned with magnetic resonance imaging (MRI) only under specific conditions. Before you have an MRI, tell your healthcare provider that you have an IUD in place, and which type of IUD you have in place. What side effects may I notice from receiving this medicine? Side effects that you should report to your doctor or health care professional as soon as possible:  allergic reactions like skin rash, itching or hives, swelling of the face, lips, or tongue  fever, flu-like symptoms  genital sores  high blood pressure  no menstrual period for 6 weeks during use  pain, swelling, warmth in the leg  pelvic pain or tenderness  severe or sudden headache  signs of pregnancy  stomach cramping  sudden shortness of breath  trouble with balance, talking, or walking  unusual vaginal bleeding, discharge  yellowing of the eyes or skin Side effects that usually do not require medical attention (report to your doctor or health care professional if they continue or are bothersome):  acne  breast pain  change in sex drive or performance  changes in weight  cramping, dizziness, or faintness while the device is being inserted  headache  irregular menstrual bleeding within first 3 to 6 months of use  nausea This list may not describe all possible side effects. Call your doctor for medical advice about side effects. You may report side effects to FDA at 1-800-FDA-1088. Where should I keep my medicine? This does not apply. NOTE: This sheet is a summary. It may not cover all possible  information. If you have questions about this medicine, talk to your doctor, pharmacist, or health care provider.  2020 Elsevier/Gold Standard (2017-11-13 13:22:01)

## 2019-03-01 LAB — HEPATITIS C ANTIBODY: Hep C Virus Ab: <0.1 s/co ratio (ref 0.0–0.9)

## 2019-03-01 LAB — HEPATITIS B SURFACE ANTIGEN: Hepatitis B Surface Ag: NEGATIVE

## 2019-03-01 LAB — HIV ANTIBODY (ROUTINE TESTING W REFLEX): HIV Screen 4th Generation wRfx: NONREACTIVE

## 2019-03-01 LAB — RPR: RPR Ser Ql: NONREACTIVE

## 2019-03-03 ENCOUNTER — Other Ambulatory Visit: Payer: Self-pay | Admitting: Obstetrics

## 2019-03-03 DIAGNOSIS — A599 Trichomoniasis, unspecified: Secondary | ICD-10-CM

## 2019-03-03 LAB — CERVICOVAGINAL ANCILLARY ONLY
Bacterial Vaginitis (gardnerella): POSITIVE — AB
Candida Glabrata: NEGATIVE
Candida Vaginitis: NEGATIVE
Chlamydia: NEGATIVE
Comment: NEGATIVE
Comment: NEGATIVE
Comment: NEGATIVE
Comment: NEGATIVE
Comment: NEGATIVE
Comment: NORMAL
Neisseria Gonorrhea: NEGATIVE
Trichomonas: POSITIVE — AB

## 2019-03-03 MED ORDER — METRONIDAZOLE 500 MG PO TABS: 500.0000 mg | ORAL_TABLET | Freq: Two times a day (BID) | ORAL | 2 refills | Status: DC

## 2019-03-03 NOTE — Progress Notes (Signed)
Hello - patient tested positive for trichomonas and bacterial vaginosis. Please send treatment to cover both, per protocol. Thank you, Joni Reining

## 2019-03-04 LAB — CYTOLOGY - PAP
Comment: NEGATIVE
Diagnosis: NEGATIVE
High risk HPV: NEGATIVE

## 2019-04-10 ENCOUNTER — Other Ambulatory Visit: Payer: Self-pay | Admitting: Obstetrics

## 2019-04-10 DIAGNOSIS — B9689 Other specified bacterial agents as the cause of diseases classified elsewhere: Secondary | ICD-10-CM

## 2019-04-10 DIAGNOSIS — N760 Acute vaginitis: Secondary | ICD-10-CM

## 2019-04-10 DIAGNOSIS — A5901 Trichomonal vulvovaginitis: Secondary | ICD-10-CM

## 2019-04-10 MED ORDER — TINIDAZOLE 500 MG PO TABS: 2.0000 g | ORAL_TABLET | Freq: Every day | ORAL | 0 refills | Status: DC

## 2019-06-11 ENCOUNTER — Ambulatory Visit (INDEPENDENT_AMBULATORY_CARE_PROVIDER_SITE_OTHER): Payer: Medicaid Other

## 2019-06-11 ENCOUNTER — Other Ambulatory Visit: Payer: Self-pay

## 2019-06-11 ENCOUNTER — Ambulatory Visit: Payer: Medicaid Other | Attending: Internal Medicine

## 2019-06-11 VITALS — BP 127/79 | HR 80 | Ht 67.0 in | Wt 158.0 lb

## 2019-06-11 DIAGNOSIS — Z3201 Encounter for pregnancy test, result positive: Secondary | ICD-10-CM

## 2019-06-11 DIAGNOSIS — Z20822 Contact with and (suspected) exposure to covid-19: Secondary | ICD-10-CM | POA: Diagnosis not present

## 2019-06-11 LAB — POCT URINE PREGNANCY: Preg Test, Ur: POSITIVE — AB

## 2019-06-11 NOTE — Progress Notes (Signed)
Jordan Lucas presents today for UPT. She has no unusual complaints.  LMP: 04/13/2019   EDD:01/18/2020  [redacted]w[redacted]d  OBJECTIVE: Appears well, in no apparent distress.  OB History    Gravida  6   Para  2   Term  2   Preterm      AB  3   Living  2     SAB  0   TAB  3   Ectopic      Multiple  0   Live Births  2          Home UPT Result: POSITIVE  In-Office UPT result: POSITIVE  I have reviewed the patient's medical, obstetrical, social, and family histories, and medications.   ASSESSMENT: Positive pregnancy test, unwanted pregnancy.  PLAN Prenatal care to be completed at: NOT SURE.

## 2019-06-12 LAB — NOVEL CORONAVIRUS, NAA: SARS-CoV-2, NAA: NOT DETECTED

## 2019-06-12 LAB — SARS-COV-2, NAA 2 DAY TAT

## 2019-06-24 DIAGNOSIS — Z113 Encounter for screening for infections with a predominantly sexual mode of transmission: Secondary | ICD-10-CM | POA: Diagnosis not present

## 2019-06-24 DIAGNOSIS — Z30014 Encounter for initial prescription of intrauterine contraceptive device: Secondary | ICD-10-CM | POA: Diagnosis not present

## 2019-09-19 DIAGNOSIS — Z03818 Encounter for observation for suspected exposure to other biological agents ruled out: Secondary | ICD-10-CM | POA: Diagnosis not present

## 2020-09-06 ENCOUNTER — Encounter (HOSPITAL_BASED_OUTPATIENT_CLINIC_OR_DEPARTMENT_OTHER): Payer: Self-pay

## 2020-09-06 ENCOUNTER — Emergency Department (HOSPITAL_BASED_OUTPATIENT_CLINIC_OR_DEPARTMENT_OTHER)
Admission: EM | Admit: 2020-09-06 | Discharge: 2020-09-06 | Disposition: A | Discharge status: Home / Self Care | Payer: Medicaid Other | Attending: Emergency Medicine | Admitting: Emergency Medicine

## 2020-09-06 ENCOUNTER — Emergency Department (HOSPITAL_BASED_OUTPATIENT_CLINIC_OR_DEPARTMENT_OTHER): Payer: Medicaid Other

## 2020-09-06 ENCOUNTER — Other Ambulatory Visit: Payer: Self-pay

## 2020-09-06 DIAGNOSIS — Z87891 Personal history of nicotine dependence: Secondary | ICD-10-CM | POA: Insufficient documentation

## 2020-09-06 DIAGNOSIS — M25572 Pain in left ankle and joints of left foot: Secondary | ICD-10-CM | POA: Diagnosis not present

## 2020-09-06 DIAGNOSIS — S93402A Sprain of unspecified ligament of left ankle, initial encounter: Secondary | ICD-10-CM | POA: Insufficient documentation

## 2020-09-06 DIAGNOSIS — X501XXA Overexertion from prolonged static or awkward postures, initial encounter: Secondary | ICD-10-CM | POA: Diagnosis not present

## 2020-09-06 DIAGNOSIS — S99912A Unspecified injury of left ankle, initial encounter: Secondary | ICD-10-CM | POA: Diagnosis present

## 2020-09-06 NOTE — ED Triage Notes (Signed)
Pt states headboard fell on left ankle 2 months ago. States has been painful and swollen since.

## 2020-09-06 NOTE — Discharge Instructions (Addendum)
You appear to have a sprain of your left ankle.  Please rest ice and elevate use compression and Tylenol and ibuprofen to help with inflammation.  Please use Tylenol or ibuprofen for pain.  You may use 600 mg ibuprofen every 6 hours or 1000 mg of Tylenol every 6 hours.  You may choose to alternate between the 2.  This would be most effective.  Not to exceed 4 g of Tylenol within 24 hours.  Not to exceed 3200 mg ibuprofen 24 hours.

## 2020-09-06 NOTE — ED Provider Notes (Signed)
[facility] EMERGENCY DEPARTMENT Provider Note   CSN: [id number] Arrival date & time: 09/06/20  1831     History Chief Complaint  Patient presents with   Ankle Pain    Jordan Lucas is a 40 y.o. female.  HPI Patient is a 40 year old female presented today with 2 months of left ankle pain she states it is achy constant worse with touch and movement.  States it hurts more on the outside of her ankle.  She denies any fevers chills or difficulty moving her ankle.  States that a headboard fell on her ankle 2 months ago but then she twisted her ankle right after this occurred.  She denies any other injuries.  No falls or other traumas.  She has been taking Aleve without improvement in her symptoms.  She denies any other associate symptoms.    Past Medical History:  Diagnosis Date   Medical history non-contributory     Patient Active Problem List   Diagnosis Date Noted   S/P C-section 03/09/2017    Past Surgical History:  Procedure Laterality Date   BREAST ENHANCEMENT SURGERY     CESAREAN SECTION N/A 03/09/2017   Procedure: CESAREAN SECTION;  Surgeon: Willodean Rosenthal, MD;  Location: [facility] BIRTHING SUITES;  Service: Obstetrics;  Laterality: N/A;   LIPOSUCTION       OB History     Gravida  6   Para  2   Term  2   Preterm      AB  3   Living  2      SAB  0   IAB  3   Ectopic      Multiple  0   Live Births  2           Family History  Problem Relation Age of Onset   Hypertension Mother    Asthma Brother    Heart disease Maternal Grandmother    Hypertension Maternal Grandmother    Diabetes Paternal Grandmother     Social History   Tobacco Use   Smoking status: Former    Types: Cigarettes   Smokeless tobacco: Former    Quit date: 02/17/2016  Substance Use Topics   Alcohol use: Yes    Comment: none   Drug use: Yes    Types: Marijuana    Comment: none since early preg    Home Medications Prior to Admission medications    Medication Sig Start Date End Date Taking? Authorizing Provider  acetaminophen (TYLENOL) 500 MG tablet Take 1,000 mg by mouth every 6 (six) hours as needed for mild pain or headache.    [provider]  metroNIDAZOLE (FLAGYL) 500 MG tablet Take 1 tablet (500 mg total) by mouth 2 (two) times daily. 03/03/19   Brock Bad, MD  oxyCODONE-acetaminophen (PERCOCET/ROXICET) 5-325 MG tablet Take 1-2 tablets by mouth every 4 (four) hours as needed for moderate pain or severe pain. Patient not taking: Reported on 05/10/2017 03/10/17   Tilda Burrow, MD  Prenat-FeAsp-Meth-FA-DHA w/o A (PRENATE PIXIE) 10-0.6-0.4-200 MG CAPS Take 1 tablet by mouth daily. Patient not taking: Reported on 02/28/2019 12/28/16   Conan Bowens, MD  tinidazole [facility]) 500 MG tablet Take 4 tablets (2,000 mg total) by mouth daily after breakfast. 04/10/19   Brock Bad, MD  triamcinolone ointment (KENALOG) 0.5 % Apply 1 application topically 2 (two) times daily. Patient not taking: Reported on 05/10/2017 03/08/17   Roe Coombs, CNM    Allergies    Patient  has no known allergies.  Review of Systems   Review of Systems  Constitutional:  Negative for fever.  HENT:  Negative for congestion.   Respiratory:  Negative for shortness of breath.   Cardiovascular:  Negative for chest pain.  Gastrointestinal:  Negative for abdominal distention.  Musculoskeletal:        Left ankle pain  Neurological:  Negative for dizziness and headaches.   Physical Exam Updated Vital Signs BP (!) 115/106 (BP Location: Left Arm)   Pulse 74   Temp 98 F (36.7 C) (Oral)   Resp (!) 26   Ht 5\' 7"  (1.702 m)   Wt 64.4 kg   SpO2 100%   BMI 22.24 kg/m   Physical Exam Vitals and nursing note reviewed.  Constitutional:      General: She is not in acute distress.    Appearance: Normal appearance. She is not ill-appearing.  HENT:     Head: Normocephalic and atraumatic.  Eyes:     General: No scleral icterus.        Right eye: No discharge.        Left eye: No discharge.     Conjunctiva/sclera: Conjunctivae normal.  Cardiovascular:     Pulses: Normal pulses.  Pulmonary:     Effort: Pulmonary effort is normal.     Breath sounds: No stridor.  Musculoskeletal:     Comments: Mild tenderness palpation of the left lateral malleolus.  No posterior or medial ankle tenderness.  No redness or warmth to touch.  Skin is without abrasion laceration or bruising.  Sensation intact in all toe tips.  There is some mild swelling at the lateral malleolus.  Skin:    General: Skin is warm and dry.  Neurological:     Mental Status: She is alert and oriented to person, place, and time. Mental status is at baseline.  Psychiatric:        Mood and Affect: Mood normal.        Behavior: Behavior normal.    ED Results / Procedures / Treatments   Labs (all labs ordered are listed, but only abnormal results are displayed) Labs Reviewed - No data to display  EKG None  Radiology DG Ankle Complete Left  Result Date: 09/06/2020 CLINICAL DATA:  Heavy object fell on ankle 2 months ago. Continued pain. EXAM: LEFT ANKLE COMPLETE - 3+ VIEW COMPARISON:  None. FINDINGS: There is no evidence of fracture, dislocation, or joint effusion. There is no evidence of arthropathy or other focal bone abnormality. Soft tissues are unremarkable. IMPRESSION: Negative. Electronically Signed   By: 09/08/2020 M.D.   On: 09/06/2020 19:03    Procedures Procedures   Medications Ordered in ED Medications - No data to display  ED Course  I have reviewed the triage vital signs and the nursing notes.  Pertinent labs & imaging results that were available during my care of the patient were reviewed by me and considered in my medical decision making (see chart for details).    MDM Rules/Calculators/A&P                           Patient is a 40 year old female She is here with symptoms concerning for a ankle sprain.  Her history consistent  with this.  She has been able to walk however seems to be quite uncomfortable with this.  Does notably have some swelling of the left lateral malleolus and some tenderness here.  X-ray personally reviewed  negative for any fracture.  Suspect ankle sprain.  Is been causing discomfort for quite some time now.  Will recommend orthopedic follow-up for physical therapy and additional work-up.  Placed in ASO splint and given crutches.  Return precautions given.  Discharged home with Tylenol ibuprofen recommendations.  Final Clinical Impression(s) / ED Diagnoses Final diagnoses:  Sprain of left ankle, unspecified ligament, initial encounter    Rx / DC Orders ED Discharge Orders     None        Gailen Shelter, [place] 09/06/20 1945    Vanetta Mulders, MD 09/08/20 2010

## 2020-09-07 ENCOUNTER — Telehealth: Payer: Self-pay

## 2020-09-07 NOTE — Telephone Encounter (Signed)
Transition Care Management Unsuccessful Follow-up Telephone Call  Date of discharge and from where:  09/06/2020-[facility]  Attempts:  1st Attempt  Reason for unsuccessful TCM follow-up call:  Left voice message

## 2020-09-09 NOTE — Telephone Encounter (Signed)
Transition Care Management Unsuccessful Follow-up Telephone Call  Date of discharge and from where:  09/06/2020-[facility]   Attempts:  2nd Attempt  Reason for unsuccessful TCM follow-up call:  Left voice message

## 2020-09-12 NOTE — Telephone Encounter (Signed)
Transition Care Management Unsuccessful Follow-up Telephone Call  Date of discharge and from where:  09/06/2020 from [facility]  Attempts:  3rd Attempt  Reason for unsuccessful TCM follow-up call:  Unable to reach patient

## 2021-06-06 ENCOUNTER — Ambulatory Visit: Payer: Medicaid Other | Admitting: Obstetrics & Gynecology

## 2021-06-27 ENCOUNTER — Ambulatory Visit: Payer: Medicaid Other | Admitting: Obstetrics and Gynecology

## 2021-07-04 DIAGNOSIS — Z113 Encounter for screening for infections with a predominantly sexual mode of transmission: Secondary | ICD-10-CM | POA: Diagnosis not present

## 2021-07-04 DIAGNOSIS — Z01419 Encounter for gynecological examination (general) (routine) without abnormal findings: Secondary | ICD-10-CM | POA: Diagnosis not present

## 2021-07-04 DIAGNOSIS — N898 Other specified noninflammatory disorders of vagina: Secondary | ICD-10-CM | POA: Diagnosis not present

## 2021-07-04 DIAGNOSIS — Z1151 Encounter for screening for human papillomavirus (HPV): Secondary | ICD-10-CM | POA: Diagnosis not present

## 2021-07-04 DIAGNOSIS — Z0000 Encounter for general adult medical examination without abnormal findings: Secondary | ICD-10-CM | POA: Diagnosis not present

## 2021-07-04 DIAGNOSIS — F419 Anxiety disorder, unspecified: Secondary | ICD-10-CM | POA: Insufficient documentation

## 2021-07-04 DIAGNOSIS — R350 Frequency of micturition: Secondary | ICD-10-CM | POA: Diagnosis not present

## 2021-08-03 NOTE — Progress Notes (Deleted)
   ANNUAL EXAM Patient name: Jordan Lucas MRN [id number]  Date of birth: Sep 19, 1980 Chief Complaint:   No chief complaint on file.  History of Present Illness:   Jordan Lucas is a 41 y.o. [phone] female being seen today for a routine annual exam.   Current complaints: ***  No LMP recorded.  Current birth control: Mirena IUD - placed   Last pap: 02/2019. Results were: NILM w/ HRHPV negative. H/O abnormal pap: no Last MXR: ***  Health Maintenance Due  Topic Date Due   COVID-19 Vaccine (1) Never done   TETANUS/TDAP  Never done    Review of Systems:   Pertinent items are noted in HPI Denies any headaches, blurred vision, fatigue, shortness of breath, chest pain, abdominal pain, abnormal vaginal discharge/itching/odor/irritation, problems with periods, bowel movements, urination, or intercourse unless otherwise stated above. *** Pertinent History Reviewed:  Reviewed past medical,surgical, social and family history.  Reviewed problem list, medications and allergies. Physical Assessment:  There were no vitals filed for this visit.There is no height or weight on file to calculate BMI.   Physical Examination:  General appearance - well appearing, and in no distress Mental status - alert, oriented to person, place, and time Psych:  She has a normal mood and affect Skin - warm and dry, normal color, no suspicious lesions noted Chest - effort normal, all lung fields clear to auscultation bilaterally Heart - normal rate and regular rhythm Neck:  midline trachea, no thyromegaly or nodules Breasts - breasts appear normal, no suspicious masses, no skin or nipple changes or axillary nodes Abdomen - soft, nontender, nondistended, no masses or organomegaly Pelvic -  VULVA: normal appearing vulva with no masses, tenderness or lesions  VAGINA: normal appearing vagina with normal color and discharge, no lesions  CERVIX: normal appearing cervix without discharge or lesions, no CMT UTERUS:  uterus is felt to be normal size, shape, consistency and nontender  ADNEXA: No adnexal masses or tenderness noted. Extremities:  No swelling or varicosities noted  Chaperone present for exam  No results found for this or any previous visit (from the past 24 hour(s)).  Assessment & Plan:  Diagnoses and all orders for this visit:  Encounter for annual routine gynecological examination  - Cervical cancer screening: Discussed guidelines. Pap with HPV wnl 02/2019.  - Gardasil: {Blank single:19197::"***","has not yet had. Will provide information","completed","has not yet had. Counseling provided and she declines","Has not yet had. Counseling provided and pt accepts"} - STD Testing: {Blank single:19197::"accepts","declines","not indicated"} - Birth Control: Mirena - Breast Health: Encouraged self breast awareness/SBE. Teaching provided. Discussed limits of clinical breast exam for detecting breast cancer. Rx given for MXR - F/U 12 months and prn     No orders of the defined types were placed in this encounter.   Meds: No orders of the defined types were placed in this encounter.   Follow-up: No follow-ups on file.  Milas Hock, MD 08/03/2021 1:10 PM

## 2021-08-08 ENCOUNTER — Ambulatory Visit: Payer: Medicaid Other | Admitting: Obstetrics and Gynecology

## 2021-08-08 DIAGNOSIS — Z01419 Encounter for gynecological examination (general) (routine) without abnormal findings: Secondary | ICD-10-CM

## 2021-08-11 DIAGNOSIS — Z113 Encounter for screening for infections with a predominantly sexual mode of transmission: Secondary | ICD-10-CM | POA: Diagnosis not present

## 2021-08-11 DIAGNOSIS — F419 Anxiety disorder, unspecified: Secondary | ICD-10-CM | POA: Diagnosis not present

## 2021-11-30 DIAGNOSIS — F419 Anxiety disorder, unspecified: Secondary | ICD-10-CM | POA: Diagnosis not present

## 2021-12-21 DIAGNOSIS — F419 Anxiety disorder, unspecified: Secondary | ICD-10-CM | POA: Diagnosis not present

## 2022-01-25 DIAGNOSIS — F419 Anxiety disorder, unspecified: Secondary | ICD-10-CM | POA: Diagnosis not present

## 2022-02-01 DIAGNOSIS — F419 Anxiety disorder, unspecified: Secondary | ICD-10-CM | POA: Diagnosis not present

## 2022-02-15 DIAGNOSIS — F419 Anxiety disorder, unspecified: Secondary | ICD-10-CM | POA: Diagnosis not present

## 2022-02-24 DIAGNOSIS — F419 Anxiety disorder, unspecified: Secondary | ICD-10-CM | POA: Diagnosis not present

## 2022-03-08 DIAGNOSIS — F419 Anxiety disorder, unspecified: Secondary | ICD-10-CM | POA: Diagnosis not present

## 2022-03-08 DIAGNOSIS — B3731 Acute candidiasis of vulva and vagina: Secondary | ICD-10-CM | POA: Diagnosis not present

## 2022-03-08 DIAGNOSIS — Z113 Encounter for screening for infections with a predominantly sexual mode of transmission: Secondary | ICD-10-CM | POA: Diagnosis not present

## 2022-03-08 DIAGNOSIS — R1032 Left lower quadrant pain: Secondary | ICD-10-CM | POA: Diagnosis not present

## 2022-03-08 DIAGNOSIS — B9689 Other specified bacterial agents as the cause of diseases classified elsewhere: Secondary | ICD-10-CM | POA: Diagnosis not present

## 2022-03-08 DIAGNOSIS — N760 Acute vaginitis: Secondary | ICD-10-CM | POA: Diagnosis not present

## 2022-04-04 DIAGNOSIS — T7840XA Allergy, unspecified, initial encounter: Secondary | ICD-10-CM | POA: Diagnosis not present

## 2022-04-04 DIAGNOSIS — J029 Acute pharyngitis, unspecified: Secondary | ICD-10-CM | POA: Diagnosis not present

## 2022-04-04 DIAGNOSIS — R220 Localized swelling, mass and lump, head: Secondary | ICD-10-CM | POA: Diagnosis not present

## 2022-04-05 DIAGNOSIS — F419 Anxiety disorder, unspecified: Secondary | ICD-10-CM | POA: Diagnosis not present

## 2022-04-09 DIAGNOSIS — Z7251 High risk heterosexual behavior: Secondary | ICD-10-CM | POA: Diagnosis not present

## 2022-04-09 DIAGNOSIS — Z6827 Body mass index (BMI) 27.0-27.9, adult: Secondary | ICD-10-CM | POA: Diagnosis not present

## 2022-04-09 DIAGNOSIS — Z0000 Encounter for general adult medical examination without abnormal findings: Secondary | ICD-10-CM | POA: Diagnosis not present

## 2022-04-09 DIAGNOSIS — E559 Vitamin D deficiency, unspecified: Secondary | ICD-10-CM | POA: Diagnosis not present

## 2022-04-09 DIAGNOSIS — Z1159 Encounter for screening for other viral diseases: Secondary | ICD-10-CM | POA: Diagnosis not present

## 2022-04-09 DIAGNOSIS — R0602 Shortness of breath: Secondary | ICD-10-CM | POA: Diagnosis not present

## 2022-04-09 DIAGNOSIS — R030 Elevated blood-pressure reading, without diagnosis of hypertension: Secondary | ICD-10-CM | POA: Diagnosis not present

## 2022-04-09 DIAGNOSIS — E039 Hypothyroidism, unspecified: Secondary | ICD-10-CM | POA: Diagnosis not present

## 2022-04-18 DIAGNOSIS — R0602 Shortness of breath: Secondary | ICD-10-CM | POA: Diagnosis not present

## 2022-04-18 DIAGNOSIS — R030 Elevated blood-pressure reading, without diagnosis of hypertension: Secondary | ICD-10-CM | POA: Diagnosis not present

## 2022-04-18 DIAGNOSIS — E559 Vitamin D deficiency, unspecified: Secondary | ICD-10-CM | POA: Diagnosis not present

## 2022-04-18 DIAGNOSIS — E039 Hypothyroidism, unspecified: Secondary | ICD-10-CM | POA: Diagnosis not present

## 2022-04-18 DIAGNOSIS — E7800 Pure hypercholesterolemia, unspecified: Secondary | ICD-10-CM | POA: Diagnosis not present

## 2022-04-18 DIAGNOSIS — Z6826 Body mass index (BMI) 26.0-26.9, adult: Secondary | ICD-10-CM | POA: Diagnosis not present
